# Patient Record
Sex: Female | Born: 1970 | Race: White | Hispanic: No | Marital: Single | Smoking: Never smoker
Health system: Southern US, Community
[De-identification: ages and names within clinical notes are randomized; demographics above are authoritative.]

## PROBLEM LIST (undated history)

## (undated) DIAGNOSIS — G43909 Migraine, unspecified, not intractable, without status migrainosus: Secondary | ICD-10-CM

## (undated) DIAGNOSIS — G459 Transient cerebral ischemic attack, unspecified: Secondary | ICD-10-CM

## (undated) DIAGNOSIS — R7989 Other specified abnormal findings of blood chemistry: Secondary | ICD-10-CM

## (undated) HISTORY — DX: Other specified abnormal findings of blood chemistry: R79.89

## (undated) HISTORY — DX: Transient cerebral ischemic attack, unspecified: G45.9

---

## 2004-03-04 ENCOUNTER — Inpatient Hospital Stay (HOSPITAL_COMMUNITY): Admission: AD | Admit: 2004-03-04 | Discharge: 2004-03-07 | Payer: Self-pay | Admitting: Obstetrics and Gynecology

## 2004-03-09 ENCOUNTER — Encounter: Admission: RE | Admit: 2004-03-09 | Discharge: 2004-04-08 | Payer: Self-pay | Admitting: Obstetrics and Gynecology

## 2004-04-10 ENCOUNTER — Encounter: Admission: RE | Admit: 2004-04-10 | Discharge: 2004-05-10 | Payer: Self-pay | Admitting: Obstetrics and Gynecology

## 2004-05-02 ENCOUNTER — Other Ambulatory Visit: Admission: RE | Admit: 2004-05-02 | Discharge: 2004-05-02 | Payer: Self-pay | Admitting: Obstetrics and Gynecology

## 2004-06-10 ENCOUNTER — Encounter: Admission: RE | Admit: 2004-06-10 | Discharge: 2004-07-03 | Payer: Self-pay | Admitting: Obstetrics and Gynecology

## 2005-04-23 ENCOUNTER — Other Ambulatory Visit: Admission: RE | Admit: 2005-04-23 | Discharge: 2005-04-23 | Payer: Self-pay | Admitting: Obstetrics and Gynecology

## 2006-02-17 ENCOUNTER — Inpatient Hospital Stay (HOSPITAL_COMMUNITY): Admission: AD | Admit: 2006-02-17 | Discharge: 2006-02-17 | Payer: Self-pay | Admitting: Obstetrics and Gynecology

## 2006-02-20 ENCOUNTER — Inpatient Hospital Stay (HOSPITAL_COMMUNITY): Admission: RE | Admit: 2006-02-20 | Discharge: 2006-02-23 | Payer: Self-pay | Admitting: Obstetrics and Gynecology

## 2010-08-31 ENCOUNTER — Other Ambulatory Visit: Payer: Self-pay | Admitting: Obstetrics and Gynecology

## 2012-10-28 ENCOUNTER — Other Ambulatory Visit: Payer: Self-pay | Admitting: Obstetrics and Gynecology

## 2013-08-29 ENCOUNTER — Encounter (HOSPITAL_COMMUNITY): Payer: Self-pay | Admitting: Emergency Medicine

## 2013-08-29 ENCOUNTER — Observation Stay (HOSPITAL_COMMUNITY)
Admission: EM | Admit: 2013-08-29 | Discharge: 2013-08-30 | Disposition: A | Discharge status: Home / Self Care | Payer: 59 | Attending: Internal Medicine | Admitting: Internal Medicine

## 2013-08-29 ENCOUNTER — Emergency Department (HOSPITAL_COMMUNITY): Payer: 59

## 2013-08-29 DIAGNOSIS — R200 Anesthesia of skin: Secondary | ICD-10-CM

## 2013-08-29 DIAGNOSIS — G43909 Migraine, unspecified, not intractable, without status migrainosus: Secondary | ICD-10-CM | POA: Insufficient documentation

## 2013-08-29 DIAGNOSIS — G459 Transient cerebral ischemic attack, unspecified: Principal | ICD-10-CM | POA: Insufficient documentation

## 2013-08-29 DIAGNOSIS — R29898 Other symptoms and signs involving the musculoskeletal system: Secondary | ICD-10-CM

## 2013-08-29 DIAGNOSIS — R61 Generalized hyperhidrosis: Secondary | ICD-10-CM

## 2013-08-29 DIAGNOSIS — R209 Unspecified disturbances of skin sensation: Secondary | ICD-10-CM

## 2013-08-29 DIAGNOSIS — R0602 Shortness of breath: Secondary | ICD-10-CM | POA: Insufficient documentation

## 2013-08-29 DIAGNOSIS — I959 Hypotension, unspecified: Secondary | ICD-10-CM | POA: Insufficient documentation

## 2013-08-29 DIAGNOSIS — R42 Dizziness and giddiness: Secondary | ICD-10-CM

## 2013-08-29 LAB — RAPID URINE DRUG SCREEN, HOSP PERFORMED
AMPHETAMINES: NOT DETECTED
BENZODIAZEPINES: NOT DETECTED
Barbiturates: NOT DETECTED
COCAINE: NOT DETECTED
Opiates: POSITIVE — AB
TETRAHYDROCANNABINOL: NOT DETECTED

## 2013-08-29 LAB — APTT: aPTT: 29 seconds (ref 24–37)

## 2013-08-29 LAB — CBC
HCT: 41.8 % (ref 36.0–46.0)
HEMATOCRIT: 41.6 % (ref 36.0–46.0)
Hemoglobin: 14.1 g/dL (ref 12.0–15.0)
Hemoglobin: 14.4 g/dL (ref 12.0–15.0)
MCH: 30.5 pg (ref 26.0–34.0)
MCH: 30.8 pg (ref 26.0–34.0)
MCHC: 33.9 g/dL (ref 30.0–36.0)
MCHC: 34.4 g/dL (ref 30.0–36.0)
MCV: 89.8 fL (ref 78.0–100.0)
MCV: 89.5 fL (ref 78.0–100.0)
PLATELETS: 224 10*3/uL (ref 150–400)
Platelets: 214 10*3/uL (ref 150–400)
RBC: 4.63 MIL/uL (ref 3.87–5.11)
RBC: 4.67 MIL/uL (ref 3.87–5.11)
RDW: 13.1 % (ref 11.5–15.5)
RDW: 13.2 % (ref 11.5–15.5)
WBC: 6.1 10*3/uL (ref 4.0–10.5)
WBC: 5.6 10*3/uL (ref 4.0–10.5)

## 2013-08-29 LAB — DIFFERENTIAL
BASOS ABS: 0 10*3/uL (ref 0.0–0.1)
BASOS PCT: 0 % (ref 0–1)
EOS PCT: 3 % (ref 0–5)
Eosinophils Absolute: 0.2 10*3/uL (ref 0.0–0.7)
Lymphocytes Relative: 15 % (ref 12–46)
Lymphs Abs: 0.9 10*3/uL (ref 0.7–4.0)
MONO ABS: 0.6 10*3/uL (ref 0.1–1.0)
Monocytes Relative: 10 % (ref 3–12)
NEUTROS ABS: 4.3 10*3/uL (ref 1.7–7.7)
Neutrophils Relative %: 71 % (ref 43–77)

## 2013-08-29 LAB — COMPREHENSIVE METABOLIC PANEL
ALBUMIN: 3.9 g/dL (ref 3.5–5.2)
ALT: 20 U/L (ref 0–35)
AST: 14 U/L (ref 0–37)
Alkaline Phosphatase: 53 U/L (ref 39–117)
BUN: 9 mg/dL (ref 6–23)
CALCIUM: 9.3 mg/dL (ref 8.4–10.5)
CHLORIDE: 107 meq/L (ref 96–112)
CO2: 22 mEq/L (ref 19–32)
CREATININE: 0.71 mg/dL (ref 0.50–1.10)
GFR calc Af Amer: >90 mL/min (ref >90)
GFR calc non Af Amer: >90 mL/min (ref >90)
Glucose, Bld: 89 mg/dL (ref 70–99)
Potassium: 3.7 mEq/L (ref 3.7–5.3)
Sodium: 140 mEq/L (ref 137–147)
Total Bilirubin: 0.3 mg/dL (ref 0.3–1.2)
Total Protein: 7.6 g/dL (ref 6.0–8.3)

## 2013-08-29 LAB — I-STAT CHEM 8, ED
BUN: 8 mg/dL (ref 6–23)
CHLORIDE: 107 meq/L (ref 96–112)
Calcium, Ion: 1.27 mmol/L — ABNORMAL HIGH (ref 1.12–1.23)
Creatinine, Ser: 0.9 mg/dL (ref 0.50–1.10)
Glucose, Bld: 88 mg/dL (ref 70–99)
HCT: 45 % (ref 36.0–46.0)
Hemoglobin: 15.3 g/dL — ABNORMAL HIGH (ref 12.0–15.0)
Potassium: 3.6 mEq/L — ABNORMAL LOW (ref 3.7–5.3)
Sodium: 144 mEq/L (ref 137–147)
TCO2: 21 mmol/L (ref 0–100)

## 2013-08-29 LAB — ETHANOL

## 2013-08-29 LAB — PROTIME-INR
INR: 1 (ref 0.00–1.49)
Prothrombin Time: 13 seconds (ref 11.6–15.2)

## 2013-08-29 LAB — URINALYSIS, ROUTINE W REFLEX MICROSCOPIC
BILIRUBIN URINE: NEGATIVE
Glucose, UA: NEGATIVE mg/dL
KETONES UR: NEGATIVE mg/dL
Leukocytes, UA: NEGATIVE
Nitrite: NEGATIVE
Protein, ur: NEGATIVE mg/dL
Specific Gravity, Urine: 1.02 (ref 1.005–1.030)
UROBILINOGEN UA: 0.2 mg/dL (ref 0.0–1.0)
pH: 7.5 (ref 5.0–8.0)

## 2013-08-29 LAB — CREATININE, SERUM
CREATININE: 0.56 mg/dL (ref 0.50–1.10)
GFR calc Af Amer: >90 mL/min (ref >90)

## 2013-08-29 LAB — URINE MICROSCOPIC-ADD ON

## 2013-08-29 LAB — I-STAT TROPONIN, ED: Troponin i, poc: 0 ng/mL (ref 0.00–0.08)

## 2013-08-29 LAB — CBG MONITORING, ED: Glucose-Capillary: 106 mg/dL — ABNORMAL HIGH (ref 70–99)

## 2013-08-29 LAB — TROPONIN I
Troponin I: <0.3 ng/mL (ref <0.30)
Troponin I: <0.3 ng/mL (ref <0.30)

## 2013-08-29 MED ORDER — AZITHROMYCIN 500 MG PO TABS
500.0000 mg | ORAL_TABLET | Freq: Every day | ORAL | Status: DC
  Administered 2013-08-29 – 2013-08-30 (×2): 500 mg via ORAL
  Filled 2013-08-29 (×2): qty 1

## 2013-08-29 MED ORDER — LORATADINE 10 MG PO TABS
10.0000 mg | ORAL_TABLET | Freq: Every day | ORAL | Status: DC
  Filled 2013-08-29 (×2): qty 1

## 2013-08-29 MED ORDER — HEPARIN SODIUM (PORCINE) 5000 UNIT/ML IJ SOLN
5000.0000 [IU] | Freq: Three times a day (TID) | INTRAMUSCULAR | Status: DC
  Administered 2013-08-29 – 2013-08-30 (×2): 5000 [IU] via SUBCUTANEOUS
  Filled 2013-08-29 (×5): qty 1

## 2013-08-29 MED ORDER — SUMATRIPTAN SUCCINATE 100 MG PO TABS
100.0000 mg | ORAL_TABLET | ORAL | Status: DC | PRN
  Filled 2013-08-29: qty 1

## 2013-08-29 MED ORDER — ACETAMINOPHEN 325 MG PO TABS: 650.0000 mg | ORAL_TABLET | Freq: Four times a day (QID) | ORAL | Status: DC | PRN

## 2013-08-29 MED ORDER — ACETAMINOPHEN 650 MG RE SUPP: 650.0000 mg | Freq: Four times a day (QID) | RECTAL | Status: DC | PRN

## 2013-08-29 MED ORDER — TOPIRAMATE 25 MG PO TABS
125.0000 mg | ORAL_TABLET | Freq: Every day | ORAL | Status: DC
  Administered 2013-08-29: 125 mg via ORAL
  Filled 2013-08-29 (×2): qty 1

## 2013-08-29 MED ORDER — SODIUM CHLORIDE 0.9 % IV BOLUS (SEPSIS)
500.0000 mL | Freq: Once | INTRAVENOUS | Status: AC
  Administered 2013-08-29: 500 mL via INTRAVENOUS

## 2013-08-29 MED ORDER — ONDANSETRON HCL 4 MG/2ML IJ SOLN: 4.0000 mg | Freq: Four times a day (QID) | INTRAMUSCULAR | Status: DC | PRN

## 2013-08-29 MED ORDER — ONDANSETRON HCL 4 MG PO TABS: 4.0000 mg | ORAL_TABLET | Freq: Four times a day (QID) | ORAL | Status: DC | PRN

## 2013-08-29 MED ORDER — MORPHINE SULFATE 2 MG/ML IJ SOLN: 2.0000 mg | INTRAMUSCULAR | Status: DC | PRN

## 2013-08-29 MED ORDER — POLYETHYLENE GLYCOL 3350 17 G PO PACK
17.0000 g | PACK | Freq: Every day | ORAL | Status: DC | PRN
  Filled 2013-08-29: qty 1

## 2013-08-29 MED ORDER — NITROGLYCERIN 0.4 MG SL SUBL: 0.4000 mg | SUBLINGUAL_TABLET | SUBLINGUAL | Status: DC | PRN

## 2013-08-29 MED ORDER — SODIUM CHLORIDE 0.9 % IJ SOLN
3.0000 mL | Freq: Two times a day (BID) | INTRAMUSCULAR | Status: DC
Start: 2013-08-29 — End: 2013-08-30
  Administered 2013-08-29 – 2013-08-30 (×2): 3 mL via INTRAVENOUS

## 2013-08-29 NOTE — ED Notes (Signed)
I have been unable to obtain IV access ( I tried twice in her right arm).  She is in CT as I write this; and I have just given phone report to [place] [employer] on [facility].  Her husband, who had been with her had to "leave to take care of our children".  Dr. Manus Gunningancour spoke with him before his departure, and he is aware that we are about to transport pt. To [facility] to see Neurology.  She has been awake, alert and oriented x 4 with clear speech.

## 2013-08-29 NOTE — ED Notes (Signed)
[facility] is here and she is transported without incident.

## 2013-08-29 NOTE — Code Documentation (Signed)
43 year old female presented to [facility] ED via private vehicle with stroke like sx.  [facility]5 for continum of care.  [facility].  See flowsheet for times.  Patient and husband report that all was normal this AM- while having breakfast she noticed sudden onset of left hand and arm numbness and weakness.  She describes the sensation as starting in her hand and working its way to her shoulder.  She and husband describe not being able to move the arm for less than a minute.  Denies other sx.  She does have hx of miagraines - states she had headache last night - took her meds and headache was gone this AM.  She also reports recent sinus infection for which she is taking a Z-pack currently.  No blurred vision.  She states this sx have never been present in the past.  NIHHS is currently 0 and she reports all sx have resolved.  Dr. Roseanne RenoStewart in - updated - examined patient.  Stroke clock reset to 0.  Handoff to McDonald[employer] CorporationChristina RN.  Patient and husband updated by Dr. Roseanne RenoStewart - instructed to call immediately for any recurrent stroke like sx.

## 2013-08-29 NOTE — ED Provider Notes (Signed)
Patient received as a code stroke from [facility]. Full H&P done by Dr. Manus Gunningancour.  She's awake, alert, and oriented. Reports improving left upper extremity weakness.  Dr.Stewart at the bedside.  Recommends MRI and admission for TIA w/u.  Shon Batonourtney F Horton, MD 08/29/13 1153

## 2013-08-29 NOTE — Consult Note (Signed)
Referring Physician: Dr. Boyce Medici    Chief Complaint: Acute onset of numbness and weakness involving left arm.  HPI: Penny Kerr is an 43 y.o. female history of migraine headaches who experienced acute onset of numbness and weakness involving left hand and arm at 9:15 AM today. She's had no previous history of focal deficits, including no deficits associated with migraine headaches. She has not had a headache today. CT scan of her head showed no acute intracranial abnormality. Numbness has resolved since arriving in the emergency room. NIH stroke score at this point is 0. Patient has not been on antiplatelet therapy.  LSN: 9:15 AM on 08/29/2013 tPA Given: No: Deficits resolved  MRankin: 0  History reviewed. No pertinent past medical history.  History reviewed. No pertinent family history.   Medications: I have reviewed the patient's current medications.  ROS: History obtained from the patient  General ROS: negative for - chills, fatigue, fever, night sweats, weight gain or weight loss Psychological ROS: negative for - behavioral disorder, hallucinations, memory difficulties, mood swings or suicidal ideation Ophthalmic ROS: negative for - blurry vision, double vision, eye pain or loss of vision ENT ROS: negative for - epistaxis, nasal discharge, oral lesions, sore throat, tinnitus or vertigo Allergy and Immunology ROS: negative for - hives or itchy/watery eyes Hematological and Lymphatic ROS: negative for - bleeding problems, bruising or swollen lymph nodes Endocrine ROS: negative for - galactorrhea, hair pattern changes, polydipsia/polyuria or temperature intolerance Respiratory ROS: negative for - cough, hemoptysis, shortness of breath or wheezing Cardiovascular ROS: negative for - chest pain, dyspnea on exertion, edema or irregular heartbeat Gastrointestinal ROS: negative for - abdominal pain, diarrhea, hematemesis, nausea/vomiting or stool incontinence Genito-Urinary ROS: negative  for - dysuria, hematuria, incontinence or urinary frequency/urgency Musculoskeletal ROS: negative for - joint swelling or muscular weakness Neurological ROS: as noted in HPI Dermatological ROS: negative for rash and skin lesion changes  Physical Examination: Blood pressure 111/69, pulse 65, temperature 98.1 F (36.7 C), temperature source Oral, resp. rate 17, height 5\' 2"  (1.575 m), weight 76.204 kg (168 lb), last menstrual period 08/29/2013, SpO2 100.00%.  Neurologic Examination: Mental Status: Alert, oriented, thought content appropriate.  Speech fluent without evidence of aphasia. Able to follow commands without difficulty. Cranial Nerves: II-Visual fields were normal. III/IV/VI-Pupils were equal and reacted. Extraocular movements were full and conjugate.    V/VII-no facial numbness and no facial weakness. VIII-normal. X-normal speech and symmetrical palatal movement. Motor: 5/5 bilaterally with normal tone and bulk Sensory: Normal throughout. Deep Tendon Reflexes: 1+ and symmetric. Plantars: Flexor bilaterally Cerebellar: Normal finger-to-nose testing. Carotid auscultation: Normal  Ct Head Wo Contrast  08/29/2013   CLINICAL DATA:  Code stroke. Shortness of breath and left arm numbness.  EXAM: CT HEAD WITHOUT CONTRAST  TECHNIQUE: Contiguous axial images were obtained from the base of the skull through the vertex without contrast.  COMPARISON:  None  FINDINGS: Normal appearance of the intracranial structures. No evidence for acute hemorrhage, mass lesion, midline shift, hydrocephalus or large infarct. No acute bony abnormality. The visualized sinuses are clear.  IMPRESSION: No acute intracranial abnormality.  Critical Value/emergent results were called by telephone at the time of interpretation on 08/29/2013 at 10:45 AM to Dr. Glynn Octave , who verbally acknowledged these results.   Electronically Signed   By: Davonna Belling M.D.   On: 08/29/2013 10:45   Dg Chest Portable 1  View  08/29/2013   CLINICAL DATA:  SHORTNESS OF BREATH NUMBNESS  EXAM: PORTABLE CHEST - 1 VIEW  COMPARISON:  None.  FINDINGS: Low lung volumes. Mild to moderately enlarged. There is diffuse prominence of the interstitial markings and peribronchial cuffing. No focal regions of consolidation or focal infiltrates. Osseous structures unremarkable.  IMPRESSION: Interstitial infiltrate, differential considerations are pulmonary edema, an infectious or inflammatory infiltrate cannot be excluded though are of lower differential consideration.   Electronically Signed   By: Salome HolmesHector  Cooper M.D.   On: 08/29/2013 10:53    Assessment: 43 y.o. female presenting with probable transient ischemic attack, although patient has history of migraine headaches and symptoms could conceivably be manifestations of migraine phenomena with vasospasm. Acute right subcortical cerebral infarction cannot be ruled out at this point.  Stroke Risk Factors - family history  Plan: 1. HgbA1c, fasting lipid panel 2. MRI, MRA  of the brain without contrast 3. PT consult, OT consult, Speech consult 4. Echocardiogram 5. Carotid dopplers 6. Prophylactic therapy-Antiplatelet med: Aspirin 81 mg per day 7. Studies to rule out hypercoagulable state 8. Telemetry monitoring   C.R. Roseanne RenoStewart, MD [facility]  08/29/2013, 11:39 AM

## 2013-08-29 NOTE — ED Notes (Addendum)
Pt states that she began having sob and lt arm numbness this morning.  Denies this ever happening before.  Denies chest pain.  States that she is on cough syrup and abx for a recent sinus infection and cough.

## 2013-08-29 NOTE — ED Provider Notes (Signed)
CSN: [id number]     Arrival date & time 08/29/13  6295 History   First MD Initiated Contact with Patient 08/29/13 1001     Chief Complaint  Patient presents with  . Shortness of Breath  . Numbness     (Consider location/radiation/quality/duration/timing/severity/associated sxs/prior Treatment) HPI Comments: Patient presents with numbness and tingling and weakness in her left arm that onset around 9:15 AM. She sitting at the table eating breakfast when this started. She reports numbness and tingling in her entire left arm which lasted 3-5 minutes before resolving. She continues to have numbness and tingling in her left hand and weakness in the arm. Initially she had some shortness of breath but this is resolved. No chest pain. No headache, difficulty speaking or swallowing. No weakness numbness or tingling in her legs. She endorses a history of recent diagnosis of sinusitis and is on Zithromax. History of migraines as well. She does not smoke.  The history is provided by the patient and the spouse.    History reviewed. No pertinent past medical history. Past Surgical History  Procedure Laterality Date  . Cesarean section     History reviewed. No pertinent family history. History  Substance Use Topics  . Smoking status: Never Smoker   . Smokeless tobacco: Not on file  . Alcohol Use: No   OB History   Grav Para Term Preterm Abortions TAB SAB Ect Mult Living                 Review of Systems  Constitutional: Negative for activity change and appetite change.  HENT: Negative for congestion and rhinorrhea.   Respiratory: Positive for shortness of breath. Negative for cough.   Cardiovascular: Negative for chest pain.  Gastrointestinal: Negative for nausea, vomiting and abdominal pain.  Genitourinary: Negative for dysuria, hematuria, vaginal bleeding and vaginal discharge.  Musculoskeletal: Negative for back pain.  Skin: Negative for rash.  Neurological: Positive for weakness and  numbness. Negative for dizziness, facial asymmetry and headaches.  A complete 10 system review of systems was obtained and all systems are negative except as noted in the HPI and PMH.      Allergies  Review of patient's allergies indicates no known allergies.  Home Medications   No current outpatient prescriptions on file. BP 121/63  Pulse 74  Temp(Src) 98.1 F (36.7 C) (Oral)  Resp 18  Ht 5\' 2"  (1.575 m)  Wt 168 lb (76.204 kg)  BMI 30.72 kg/m2  SpO2 100%  LMP 08/29/2013 Physical Exam  Constitutional: She is oriented to person, place, and time. She appears well-developed and well-nourished. No distress.  HENT:  Head: Normocephalic and atraumatic.  Mouth/Throat: Oropharynx is clear and moist. No oropharyngeal exudate.  Eyes: Conjunctivae and EOM are normal. Pupils are equal, round, and reactive to light.  Neck: Normal range of motion. Neck supple.  Cardiovascular: Normal rate, regular rhythm and normal heart sounds.   Pulmonary/Chest: Effort normal and breath sounds normal.  Abdominal: Soft. Bowel sounds are normal. There is no tenderness. There is no rebound and no guarding.  Musculoskeletal: Normal range of motion. She exhibits no edema and no tenderness.  Neurological: She is alert and oriented to person, place, and time. No cranial nerve deficit. She exhibits normal muscle tone. Coordination normal.  CN 2-12 intact, no ataxia on finger to nose, no nystagmus, t, no pronator drift, Romberg negative, normal gait. 4 of 5 strength in the left upper extremity, 5 out of 5 strength in the right upper extremity no  pronator drift Decreased sensation over left dorsal arm  Skin: Skin is warm.    ED Course  Procedures (including critical care time) Labs Review Labs Reviewed  URINE RAPID DRUG SCREEN (HOSP PERFORMED) - Abnormal; Notable for the following:    Opiates POSITIVE (*)    All other components within normal limits  URINALYSIS, ROUTINE W REFLEX MICROSCOPIC - Abnormal;  Notable for the following:    APPearance TURBID (*)    Hgb urine dipstick LARGE (*)    All other components within normal limits  I-STAT CHEM 8, ED - Abnormal; Notable for the following:    Potassium 3.6 (*)    Calcium, Ion 1.27 (*)    Hemoglobin 15.3 (*)    All other components within normal limits  CBG MONITORING, ED - Abnormal; Notable for the following:    Glucose-Capillary 106 (*)    All other components within normal limits  ETHANOL  PROTIME-INR  APTT  CBC  DIFFERENTIAL  COMPREHENSIVE METABOLIC PANEL  URINE MICROSCOPIC-ADD ON  CBC  CREATININE, SERUM  TROPONIN I  TROPONIN I  TROPONIN I  I-STAT TROPOININ, ED  I-STAT TROPOININ, ED   Imaging Review Ct Head Wo Contrast  08/29/2013   CLINICAL DATA:  Code stroke. Shortness of breath and left arm numbness.  EXAM: CT HEAD WITHOUT CONTRAST  TECHNIQUE: Contiguous axial images were obtained from the base of the skull through the vertex without contrast.  COMPARISON:  None  FINDINGS: Normal appearance of the intracranial structures. No evidence for acute hemorrhage, mass lesion, midline shift, hydrocephalus or large infarct. No acute bony abnormality. The visualized sinuses are clear.  IMPRESSION: No acute intracranial abnormality.  Critical Value/emergent results were called by telephone at the time of interpretation on 08/29/2013 at 10:45 AM to Dr. Glynn Octave , who verbally acknowledged these results.   Electronically Signed   By: Davonna Belling M.D.   On: 08/29/2013 10:45   Mr Brain Wo Contrast  08/29/2013   CLINICAL DATA:  Left upper extremity numbness now resolved. Complicated migraine versus subcortical infarction/TIA.  EXAM: MRI HEAD WITHOUT CONTRAST  TECHNIQUE: Multiplanar, multiecho pulse sequences of the brain and surrounding structures were obtained without intravenous contrast.  COMPARISON:  CT HEAD W/O CM dated 08/29/2013  FINDINGS: No evidence for acute infarction, hemorrhage, mass lesion, hydrocephalus, or extra-axial fluid.  There is no atrophy or white matter disease. Flow voids are maintained throughout the carotid, basilar, and vertebral arteries. There are no areas of chronic hemorrhage. Pituitary, pineal, and cerebellar tonsils unremarkable. No upper cervical lesions. Visualized calvarium, skull base, and upper cervical osseous structures unremarkable. Scalp and extracranial soft tissues, orbits, sinuses, and mastoids show no acute process.  IMPRESSION: Unremarkable cranial MRI. No acute or focal intracranial abnormality. Good general agreement with prior head CT.   Electronically Signed   By: Davonna Belling M.D.   On: 08/29/2013 13:32   Dg Chest Portable 1 View  08/29/2013   CLINICAL DATA:  SHORTNESS OF BREATH NUMBNESS  EXAM: PORTABLE CHEST - 1 VIEW  COMPARISON:  None.  FINDINGS: Low lung volumes. Mild to moderately enlarged. There is diffuse prominence of the interstitial markings and peribronchial cuffing. No focal regions of consolidation or focal infiltrates. Osseous structures unremarkable.  IMPRESSION: Interstitial infiltrate, differential considerations are pulmonary edema, an infectious or inflammatory infiltrate cannot be excluded though are of lower differential consideration.   Electronically Signed   By: Salome Holmes M.D.   On: 08/29/2013 10:53     EKG Interpretation   Date/Time:  Saturday August 29 2013 10:13:18 EDT Ventricular Rate:  72 PR Interval:  184 QRS Duration: 74 QT Interval:  425 QTC Calculation: 465 R Axis:   79 Text Interpretation:  Sinus rhythm Borderline repolarization abnormality  No previous ECGs available Confirmed by Amanee Iacovelli  MD, Tzipora Mcinroy ([id number]) on  08/29/2013 10:18:05 AM      MDM   Final diagnoses:  Left arm weakness   Acute onset of left arm numbness and weakness. Seems to be improving though the weakness persists. Patient with minimal stroke risk factors. Code stroke called on arrival. Does not appear to be tPA candidate 2/2 minimal deficits and rapidly improving  symptoms.  EKG is normal sinus rhythm. Case discussed with Dr. Roseanne RenoStewart who [facility]. Her NIH scale is 1 however and her deficits are minimal. D/w Dr. Wilkie AyeHorton and Dr. Rhys MartiniStewart  Mozell Hardacre, MD 08/29/13 [id number]

## 2013-08-29 NOTE — ED Notes (Signed)
Dr. Wilkie AyeHorton at bedside. Stanton Kidneyebra, RN at bedside. Pt [id number]

## 2013-08-29 NOTE — H&P (Addendum)
[facility] History and Physical  Penny Kerr ZOX:[id number] DOB: [birth year] DOA: 08/29/2013  Referring physician: Emergency Department PCP: No primary provider on file.  Specialists:   Chief Complaint: L arm numbness/weakness  HPI: Penny Kerr is a 43 y.o. female  With no significant PMH who presents to the ED with acute onset L arm numbness and weakness. By arrival to Ed, sx had resolved. In the ED, the pt was noted to have an unremarkable head CT. Neurology was consulted with recommendations for CVA w/u. Hospitalist service was consulted for admission.  On further questioning, pt reports sx that started around 9am on day of admit associated with diaphoresis, sob, palpitations, and dizziness.  Review of Systems:  Per above, the remainder of the 10pt ros reviewed and are neg  History reviewed. No pertinent past medical history. Past Surgical History  Procedure Laterality Date  . Cesarean section     Social History:  reports that she has never smoked. She does not have any smokeless tobacco history on file. She reports that she does not drink alcohol or use illicit drugs.  where does patient live--home, ALF, SNF? and with whom if at home?  Can patient participate in ADLs?  No Known Allergies  History reviewed. No pertinent family history.  (be sure to complete)  Prior to Admission medications   Medication Sig Start Date End Date Taking? Authorizing Provider  azithromycin (ZITHROMAX) 500 MG tablet Take 500 mg by mouth daily.   Yes Historical Provider, MD  cetirizine (ZYRTEC) 10 MG tablet Take 10 mg by mouth daily.   Yes Historical Provider, MD  HYDROcodone-homatropine (HYCODAN) 5-1.5 MG/5ML syrup Take 5 mLs by mouth every 6 (six) hours as needed for cough.   Yes Historical Provider, MD  SUMAtriptan (IMITREX) 100 MG tablet Take 100 mg by mouth every 2 (two) hours as needed for migraine.  08/08/13  Yes Historical Provider, MD  topiramate (TOPAMAX) 25 MG tablet Take 125  mg by mouth daily.  07/13/13  Yes Historical Provider, MD   Physical Exam: Filed Vitals:   08/29/13 1013 08/29/13 1127 08/29/13 1130 08/29/13 1200  BP: 111/65 111/69 117/69 100/60  Pulse: 73 65 80 70  Temp: 97.5 F (36.4 C) 98.1 F (36.7 C)    TempSrc: Oral Oral    Resp: 20 17 23 16   Height:  5\' 2"  (1.575 m)    Weight:  76.204 kg (168 lb)    SpO2: 100% 100% 100% 100%     General:  Awake, in nad  Eyes: PERRL B  ENT: membranes moist, dentition fair  Neck: trachea midline, neck supple  Cardiovascular: regular, s1, s2  Respiratory: normal resp effort, no wheezing  Abdomen: soft, nondistended  Skin: normal skin turgor, no abnormal skin lesions seen  Musculoskeletal: perfused, no clubbing  Psychiatric: mood/affect normal // no auditory/visual hallucindations  Neurologic: cn 2-12 grossly intact, strength/sensation intact  Labs on Admission:  Basic Metabolic Panel:  Recent Labs Lab 08/29/13 1050 08/29/13 1102  NA 140 144  K 3.7 3.6*  CL 107 107  CO2 22  --   GLUCOSE 89 88  BUN 9 8  CREATININE 0.71 0.90  CALCIUM 9.3  --    Liver Function Tests:  Recent Labs Lab 08/29/13 1050  AST 14  ALT 20  ALKPHOS 53  BILITOT 0.3  PROT 7.6  ALBUMIN 3.9   No results found for this basename: LIPASE, AMYLASE,  in the last 168 hours No results found for this basename: AMMONIA,  in the last 168 hours CBC:  Recent Labs Lab 08/29/13 1050 08/29/13 1102  WBC 6.1  --   NEUTROABS 4.3  --   HGB 14.1 15.3*  HCT 41.6 45.0  MCV 89.8  --   PLT 214  --    Cardiac Enzymes: No results found for this basename: CKTOTAL, CKMB, CKMBINDEX, TROPONINI,  in the last 168 hours  BNP (last 3 results) No results found for this basename: PROBNP,  in the last 8760 hours CBG:  Recent Labs Lab 08/29/13 1129  GLUCAP 106*    Radiological Exams on Admission: Ct Head Wo Contrast  08/29/2013   CLINICAL DATA:  Code stroke. Shortness of breath and left arm numbness.  EXAM: CT HEAD  WITHOUT CONTRAST  TECHNIQUE: Contiguous axial images were obtained from the base of the skull through the vertex without contrast.  COMPARISON:  None  FINDINGS: Normal appearance of the intracranial structures. No evidence for acute hemorrhage, mass lesion, midline shift, hydrocephalus or large infarct. No acute bony abnormality. The visualized sinuses are clear.  IMPRESSION: No acute intracranial abnormality.  Critical Value/emergent results were called by telephone at the time of interpretation on 08/29/2013 at 10:45 AM to Dr. Glynn OctaveSTEPHEN RANCOUR , who verbally acknowledged these results.   Electronically Signed   By: Davonna BellingJohn  Curnes M.D.   On: 08/29/2013 10:45   Dg Chest Portable 1 View  08/29/2013   CLINICAL DATA:  SHORTNESS OF BREATH NUMBNESS  EXAM: PORTABLE CHEST - 1 VIEW  COMPARISON:  None.  FINDINGS: Low lung volumes. Mild to moderately enlarged. There is diffuse prominence of the interstitial markings and peribronchial cuffing. No focal regions of consolidation or focal infiltrates. Osseous structures unremarkable.  IMPRESSION: Interstitial infiltrate, differential considerations are pulmonary edema, an infectious or inflammatory infiltrate cannot be excluded though are of lower differential consideration.   Electronically Signed   By: Salome HolmesHector  Cooper M.D.   On: 08/29/2013 10:53    EKG: Independently reviewed. NSR  Assessment/Plan Active Problems:   Left arm numbness   TIA (transient ischemic attack)   1. L arm numbness 1. Neurology was consulted through ED 2. Recs for CVA w/u including MRI, dopplers, echo, pt/ot/slp 3. ASA per neuro recs 4. On further questioning, pt also reports diaphoresis, dizziness, and sob with presenting sx 5. Will check serial cardiac enzymes and repeat ekg in am 6. Admit to med-tele 2. DVT prophylaxis 1. Heparin subQ  Code Status: Full (must indicate code status--if unknown or must be presumed, indicate so) Family Communication: Pt in room (indicate person spoken  with, if applicable, with phone number if by telephone) Disposition Plan: Pending (indicate anticipated LOS)  Time spent: 35min  Anatasia Tino K [facility] Pager [phone]  If 7PM-7AM, please contact night-coverage [email or web address] Password [email or web address] 08/29/2013, 12:55 PM

## 2013-08-29 NOTE — ED Notes (Signed)
Symptoms are resolved at this time.

## 2013-08-30 DIAGNOSIS — G459 Transient cerebral ischemic attack, unspecified: Secondary | ICD-10-CM

## 2013-08-30 DIAGNOSIS — R29898 Other symptoms and signs involving the musculoskeletal system: Secondary | ICD-10-CM

## 2013-08-30 DIAGNOSIS — R209 Unspecified disturbances of skin sensation: Secondary | ICD-10-CM

## 2013-08-30 DIAGNOSIS — R42 Dizziness and giddiness: Secondary | ICD-10-CM

## 2013-08-30 LAB — COMPREHENSIVE METABOLIC PANEL
ALT: 18 U/L (ref 0–35)
AST: 15 U/L (ref 0–37)
Albumin: 3.5 g/dL (ref 3.5–5.2)
Alkaline Phosphatase: 48 U/L (ref 39–117)
BILIRUBIN TOTAL: 0.3 mg/dL (ref 0.3–1.2)
BUN: 12 mg/dL (ref 6–23)
CALCIUM: 9 mg/dL (ref 8.4–10.5)
CHLORIDE: 107 meq/L (ref 96–112)
CO2: 18 meq/L — AB (ref 19–32)
CREATININE: 0.63 mg/dL (ref 0.50–1.10)
GFR calc non Af Amer: >90 mL/min (ref >90)
GLUCOSE: 87 mg/dL (ref 70–99)
Potassium: 3.8 mEq/L (ref 3.7–5.3)
Sodium: 141 mEq/L (ref 137–147)
Total Protein: 7 g/dL (ref 6.0–8.3)

## 2013-08-30 LAB — CBC
HCT: 41.2 % (ref 36.0–46.0)
HEMOGLOBIN: 14.1 g/dL (ref 12.0–15.0)
MCH: 30.8 pg (ref 26.0–34.0)
MCHC: 34.2 g/dL (ref 30.0–36.0)
MCV: 90 fL (ref 78.0–100.0)
Platelets: 237 10*3/uL (ref 150–400)
RBC: 4.58 MIL/uL (ref 3.87–5.11)
RDW: 13.3 % (ref 11.5–15.5)
WBC: 4.9 10*3/uL (ref 4.0–10.5)

## 2013-08-30 LAB — TROPONIN I

## 2013-08-30 MED ORDER — ASPIRIN 81 MG PO TBEC: 81.0000 mg | DELAYED_RELEASE_TABLET | Freq: Every day | ORAL | Status: DC

## 2013-08-30 MED ORDER — ASPIRIN EC 81 MG PO TBEC
81.0000 mg | DELAYED_RELEASE_TABLET | Freq: Every day | ORAL | Status: DC
  Administered 2013-08-30: 81 mg via ORAL
  Filled 2013-08-30: qty 1

## 2013-08-30 NOTE — Progress Notes (Signed)
  Echocardiogram 2D Echocardiogram has been performed.  Penny Kerr, Penny Kerr 08/30/2013, 10:14 AM

## 2013-08-30 NOTE — Progress Notes (Signed)
Stroke Team Progress Note  HISTORY Penny Kerr is a 43 y.o. female with history of migraine headaches who experienced acute onset of numbness and weakness involving left hand and arm at 9:15 AM 08/29/2013. She's had no previous history of focal deficits, including no deficits associated with migraine headaches. She had not had a headache on the day of admission. CT scan of her head showed no acute intracranial abnormality. Numbness resolved after arriving in the emergency room. NIH stroke score was 0. Patient has not been on antiplatelet therapy.   LSN: 9:15 AM on 08/29/2013  tPA Given: No: Deficits resolved  MRankin: 0   SUBJECTIVE The patient feels back to normal except for a mild odd sensation in her left hand which she is unable to describe. She is anxious for discharge. We discussed risk factors for strokes and TIAs.  OBJECTIVE Most recent Vital Signs: Filed Vitals:   08/30/13 0110 08/30/13 0300 08/30/13 0700 08/30/13 0933  BP: 113/57 105/55 92/79 97/51   Pulse: 69 65 79 81  Temp: 97.8 F (36.6 C) 97.8 F (36.6 C)  98 F (36.7 C)  TempSrc: Oral Oral  Oral  Resp: 18 20 18 18   Height:      Weight:      SpO2: 100% 100% 100% 100%   CBG (last 3)   Recent Labs  08/29/13 1129  GLUCAP 106*    IV Fluid Intake:     MEDICATIONS  . aspirin EC  81 mg Oral Daily  . azithromycin  500 mg Oral Daily  . heparin  5,000 Units Subcutaneous 3 times per day  . loratadine  10 mg Oral Daily  . sodium chloride  3 mL Intravenous Q12H  . topiramate  125 mg Oral Daily   PRN:  acetaminophen, acetaminophen, morphine injection, nitroGLYCERIN, ondansetron (ZOFRAN) IV, ondansetron, polyethylene glycol, SUMAtriptan  Diet:  General thin liquids Activity:  Bedrest DVT Prophylaxis:  Subcutaneous heparin  CLINICALLY SIGNIFICANT STUDIES Basic Metabolic Panel:   Recent Labs Lab 08/29/13 1050 08/29/13 1102 08/29/13 1639 08/30/13 0350  NA 140 144  --  141  K 3.7 3.6*  --  3.8  CL 107 107  --   107  CO2 22  --   --  18*  GLUCOSE 89 88  --  87  BUN 9 8  --  12  CREATININE 0.71 0.90 0.56 0.63  CALCIUM 9.3  --   --  9.0   Liver Function Tests:   Recent Labs Lab 08/29/13 1050 08/30/13 0350  AST 14 15  ALT 20 18  ALKPHOS 53 48  BILITOT 0.3 0.3  PROT 7.6 7.0  ALBUMIN 3.9 3.5   CBC:  Recent Labs Lab 08/29/13 1050  08/29/13 1639 08/30/13 0350  WBC 6.1  --  5.6 4.9  NEUTROABS 4.3  --   --   --   HGB 14.1  < > 14.4 14.1  HCT 41.6  < > 41.8 41.2  MCV 89.8  --  89.5 90.0  PLT 214  --  224 237  < > = values in this interval not displayed. Coagulation:   Recent Labs Lab 08/29/13 1050  LABPROT 13.0  INR 1.00   Cardiac Enzymes:   Recent Labs Lab 08/29/13 1639 08/29/13 2007 08/30/13 0350  TROPONINI <0.30 <0.30 <0.30   Urinalysis:   Recent Labs Lab 08/29/13 1200  COLORURINE PINK  LABSPEC 1.020  PHURINE 7.5  GLUCOSEU NEGATIVE  HGBUR LARGE*  BILIRUBINUR NEGATIVE  KETONESUR NEGATIVE  PROTEINUR NEGATIVE  UROBILINOGEN 0.2  NITRITE NEGATIVE  LEUKOCYTESUR NEGATIVE   Lipid Panel No results found for this basename: chol,  trig,  hdl,  cholhdl,  vldl,  ldlcalc   HgbA1C  No results found for this basename: HGBA1C    Urine Drug Screen:     Component Value Date/Time   LABOPIA POSITIVE* 08/29/2013 1200   COCAINSCRNUR NONE DETECTED 08/29/2013 1200   LABBENZ NONE DETECTED 08/29/2013 1200   AMPHETMU NONE DETECTED 08/29/2013 1200   THCU NONE DETECTED 08/29/2013 1200   LABBARB NONE DETECTED 08/29/2013 1200    Alcohol Level:   Recent Labs Lab 08/29/13 1050  ETH <11    Ct Head Wo Contrast 08/29/2013    No acute intracranial abnormality.     Mr Brain Wo Contrast 08/29/2013    Unremarkable cranial MRI. No acute or focal intracranial abnormality. Good general agreement with prior head CT.      Dg Chest Portable 1 View 08/29/2013    Interstitial infiltrate, differential considerations are pulmonary edema, an infectious or inflammatory infiltrate cannot be excluded  though are of lower differential consideration.      2D Echocardiogram pending   Carotid Doppler  Findings suggest 1-39% internal carotid artery stenosis bilaterally. Vertebral arteries are patent with antegrade flow.   EKG - normal sinus rhythm rate 72 beats per minute -  For complete results please see formal report.   Therapy Recommendations - probably does not need to be seen by the therapists as the patient's deficits have resolved and the CT and MRI were negative for a stroke.  Physical Exam   Mental Status:  Alert, oriented, thought content appropriate. Speech fluent without evidence of aphasia. Able to follow commands without difficulty.  Cranial Nerves:  II-Visual fields were normal.  III/IV/VI-Pupils were equal and reacted. Extraocular movements were full and conjugate.  V/VII-no facial numbness and no facial weakness.  VIII-normal.  X-normal speech and symmetrical palatal movement.  Motor: 5/5 bilaterally with normal tone and bulk  Sensory: Normal throughout.  Deep Tendon Reflexes: 1+ and symmetric.  Plantars: Flexor bilaterally  Cerebellar: Normal finger-to-nose testing.  Carotid auscultation: Normal   ASSESSMENT Ms. Penny Kerr is a 43 y.o. female presenting with transitory left upper extremity numbness and weakness. T-PA therapy was not initiated as the patient's symptoms quickly resolved. A head CT and MRI were both negative for infarct. On no antithrombotic prior to admission. Now on no antithrombotic for secondary stroke prevention. Patient with resultant resolution of deficits. Stroke work up underway.   History of migraine headaches  Mild hypotension  UDS positive for opiates   Hospital day # 1  TREATMENT/PLAN  Continue aspirin 81 mg orally every day for possible TIA.  Await therapy evaluations  Await 2-D echo, hemoglobin A1c, lipid panel   Delton See PA-C [facility] Pager [phone] 08/30/2013, 12:09 PM  I have  personally obtained a history, examined the patient, evaluated imaging results, and formulated the assessment and plan of care. I agree with the above.    To contact Stroke Continuity provider, please refer to [email or web address]. After hours, contact General Neurology

## 2013-08-30 NOTE — Progress Notes (Signed)
*  PRELIMINARY RESULTS* Vascular Ultrasound Carotid Duplex (Doppler) has been completed.   Findings suggest 1-39% internal carotid artery stenosis bilaterally. Vertebral arteries are patent with antegrade flow.  08/30/2013 10:05 AM Gertie FeyMichelle Kajal Scalici, RVT, RDCS, RDMS

## 2013-08-30 NOTE — Discharge Summary (Signed)
Physician Discharge Summary  Penny Kerr ZOX:[id number] DOB: 1970/10/04 DOA: 08/29/2013  PCP: No primary provider on file. Dr. Martha Clan  Admit date: 08/29/2013 Discharge date: 08/30/2013  Time spent: 35 minutes  Recommendations for Outpatient Follow-up:  1. Follow up with PCP in 1-2 weeks 2. Consider outpatient stress test  Discharge Diagnoses:  Active Problems:   Left arm numbness   TIA (transient ischemic attack)   Discharge Condition: Stable  Diet recommendation: Regular  Filed Weights   08/29/13 1127  Weight: 76.204 kg (168 lb)    History of present illness:  Penny Kerr is a 43 y.o. female  With no significant PMH who presents to the ED with acute onset L arm numbness and weakness. By arrival to Ed, sx had resolved. In the ED, the pt was noted to have an unremarkable head CT. Neurology was consulted with recommendations for CVA w/u. Hospitalist service was consulted for admission.   On further questioning, pt reports sx that started around 9am on day of admit associated with diaphoresis, sob, palpitations, and dizziness.  Hospital Course:  The patient was admitted to the floor. A 2d echo and carotid dopplers were done and were unremarkable. The patient was seen by Neurology with recommendations for aspirin. Given pt's family hx of heart disease and presenting symptoms, an outpatient stress test may be warranted. Cardiac enzymes were serially negative. EKG was normal. 2D echo was without wall motion abnormality.  Consultations:  Neurology  Discharge Exam: Filed Vitals:   08/30/13 0300 08/30/13 0700 08/30/13 0933 08/30/13 1407  BP: 105/55 92/79 97/51  104/57  Pulse: 65 79 81 78  Temp: 97.8 F (36.6 C)  98 F (36.7 C) 98.4 F (36.9 C)  TempSrc: Oral  Oral Oral  Resp: 20 18 18 18   Height:      Weight:      SpO2: 100% 100% 100% 100%    General: Awake, in nad Cardiovascular: regular, s1, s2 Respiratory: normal resp effort, no wheezing  Discharge  Instructions     Medication List         aspirin 81 MG EC tablet  Take 1 tablet (81 mg total) by mouth daily.     azithromycin 500 MG tablet  Commonly known as:  ZITHROMAX  Take 500 mg by mouth daily.     cetirizine 10 MG tablet  Commonly known as:  ZYRTEC  Take 10 mg by mouth daily.     HYDROcodone-homatropine 5-1.5 MG/5ML syrup  Commonly known as:  HYCODAN  Take 5 mLs by mouth every 6 (six) hours as needed for cough.     SUMAtriptan 100 MG tablet  Commonly known as:  IMITREX  Take 100 mg by mouth every 2 (two) hours as needed for migraine.     topiramate 25 MG tablet  Commonly known as:  TOPAMAX  Take 125 mg by mouth daily.       No Known Allergies Follow-up Information   Follow up with Martha Clan, MD. Schedule an appointment as soon as possible for a visit in 1 week.   Specialty:  Internal Medicine   Contact information:   [address] [place] [address] [phone]        The results of significant diagnostics from this hospitalization (including imaging, microbiology, ancillary and laboratory) are listed below for reference.    Significant Diagnostic Studies: Ct Head Wo Contrast  08/29/2013   CLINICAL DATA:  Code stroke. Shortness of breath and left arm numbness.  EXAM: CT HEAD WITHOUT  CONTRAST  TECHNIQUE: Contiguous axial images were obtained from the base of the skull through the vertex without contrast.  COMPARISON:  None  FINDINGS: Normal appearance of the intracranial structures. No evidence for acute hemorrhage, mass lesion, midline shift, hydrocephalus or large infarct. No acute bony abnormality. The visualized sinuses are clear.  IMPRESSION: No acute intracranial abnormality.  Critical Value/emergent results were called by telephone at the time of interpretation on 08/29/2013 at 10:45 AM to Dr. Glynn Octave , who verbally acknowledged these results.   Electronically Signed   By: Davonna Belling M.D.   On: 08/29/2013 10:45   Mr Brain Wo  Contrast  08/29/2013   CLINICAL DATA:  Left upper extremity numbness now resolved. Complicated migraine versus subcortical infarction/TIA.  EXAM: MRI HEAD WITHOUT CONTRAST  TECHNIQUE: Multiplanar, multiecho pulse sequences of the brain and surrounding structures were obtained without intravenous contrast.  COMPARISON:  CT HEAD W/O CM dated 08/29/2013  FINDINGS: No evidence for acute infarction, hemorrhage, mass lesion, hydrocephalus, or extra-axial fluid. There is no atrophy or white matter disease. Flow voids are maintained throughout the carotid, basilar, and vertebral arteries. There are no areas of chronic hemorrhage. Pituitary, pineal, and cerebellar tonsils unremarkable. No upper cervical lesions. Visualized calvarium, skull base, and upper cervical osseous structures unremarkable. Scalp and extracranial soft tissues, orbits, sinuses, and mastoids show no acute process.  IMPRESSION: Unremarkable cranial MRI. No acute or focal intracranial abnormality. Good general agreement with prior head CT.   Electronically Signed   By: Davonna Belling M.D.   On: 08/29/2013 13:32   Dg Chest Portable 1 View  08/29/2013   CLINICAL DATA:  SHORTNESS OF BREATH NUMBNESS  EXAM: PORTABLE CHEST - 1 VIEW  COMPARISON:  None.  FINDINGS: Low lung volumes. Mild to moderately enlarged. There is diffuse prominence of the interstitial markings and peribronchial cuffing. No focal regions of consolidation or focal infiltrates. Osseous structures unremarkable.  IMPRESSION: Interstitial infiltrate, differential considerations are pulmonary edema, an infectious or inflammatory infiltrate cannot be excluded though are of lower differential consideration.   Electronically Signed   By: Salome Holmes M.D.   On: 08/29/2013 10:53    Microbiology: No results found for this or any previous visit (from the past 240 hour(s)).   Labs: Basic Metabolic Panel:  Recent Labs Lab 08/29/13 1050 08/29/13 1102 08/29/13 1639 08/30/13 0350  NA 140 144   --  141  K 3.7 3.6*  --  3.8  CL 107 107  --  107  CO2 22  --   --  18*  GLUCOSE 89 88  --  87  BUN 9 8  --  12  CREATININE 0.71 0.90 0.56 0.63  CALCIUM 9.3  --   --  9.0   Liver Function Tests:  Recent Labs Lab 08/29/13 1050 08/30/13 0350  AST 14 15  ALT 20 18  ALKPHOS 53 48  BILITOT 0.3 0.3  PROT 7.6 7.0  ALBUMIN 3.9 3.5   No results found for this basename: LIPASE, AMYLASE,  in the last 168 hours No results found for this basename: AMMONIA,  in the last 168 hours CBC:  Recent Labs Lab 08/29/13 1050 08/29/13 1102 08/29/13 1639 08/30/13 0350  WBC 6.1  --  5.6 4.9  NEUTROABS 4.3  --   --   --   HGB 14.1 15.3* 14.4 14.1  HCT 41.6 45.0 41.8 41.2  MCV 89.8  --  89.5 90.0  PLT 214  --  224 237   Cardiac Enzymes:  Recent Labs Lab 08/29/13 1639 08/29/13 2007 08/30/13 0350  TROPONINI <0.30 <0.30 <0.30   BNP: BNP (last 3 results) No results found for this basename: PROBNP,  in the last 8760 hours CBG:  Recent Labs Lab 08/29/13 1129  GLUCAP 106*   Signed:  CHIU, STEPHEN K  [facility] 08/30/2013, 2:58 PM

## 2013-10-30 ENCOUNTER — Other Ambulatory Visit: Payer: Self-pay | Admitting: Obstetrics and Gynecology

## 2013-11-02 LAB — CYTOLOGY - PAP

## 2014-03-17 ENCOUNTER — Emergency Department (HOSPITAL_COMMUNITY): Payer: 59

## 2014-03-17 ENCOUNTER — Encounter (HOSPITAL_COMMUNITY): Payer: Self-pay | Admitting: Emergency Medicine

## 2014-03-17 ENCOUNTER — Emergency Department (HOSPITAL_COMMUNITY)
Admission: EM | Admit: 2014-03-17 | Discharge: 2014-03-17 | Disposition: A | Discharge status: Home / Self Care | Payer: 59 | Attending: Emergency Medicine | Admitting: Emergency Medicine

## 2014-03-17 DIAGNOSIS — Z7982 Long term (current) use of aspirin: Secondary | ICD-10-CM | POA: Diagnosis not present

## 2014-03-17 DIAGNOSIS — G43909 Migraine, unspecified, not intractable, without status migrainosus: Secondary | ICD-10-CM | POA: Insufficient documentation

## 2014-03-17 DIAGNOSIS — R200 Anesthesia of skin: Secondary | ICD-10-CM | POA: Insufficient documentation

## 2014-03-17 DIAGNOSIS — R42 Dizziness and giddiness: Secondary | ICD-10-CM | POA: Diagnosis present

## 2014-03-17 DIAGNOSIS — Z79899 Other long term (current) drug therapy: Secondary | ICD-10-CM | POA: Diagnosis not present

## 2014-03-17 DIAGNOSIS — M6281 Muscle weakness (generalized): Secondary | ICD-10-CM | POA: Insufficient documentation

## 2014-03-17 HISTORY — DX: Migraine, unspecified, not intractable, without status migrainosus: G43.909

## 2014-03-17 LAB — CBC WITH DIFFERENTIAL/PLATELET
Basophils Absolute: 0 10*3/uL (ref 0.0–0.1)
Basophils Relative: 0 % (ref 0–1)
Eosinophils Absolute: 0.1 10*3/uL (ref 0.0–0.7)
Eosinophils Relative: 2 % (ref 0–5)
HCT: 42.2 % (ref 36.0–46.0)
Hemoglobin: 14 g/dL (ref 12.0–15.0)
Lymphocytes Relative: 13 % (ref 12–46)
Lymphs Abs: 0.9 10*3/uL (ref 0.7–4.0)
MCH: 29.7 pg (ref 26.0–34.0)
MCHC: 33.2 g/dL (ref 30.0–36.0)
MCV: 89.4 fL (ref 78.0–100.0)
Monocytes Absolute: 0.4 10*3/uL (ref 0.1–1.0)
Monocytes Relative: 5 % (ref 3–12)
Neutro Abs: 5.7 10*3/uL (ref 1.7–7.7)
Neutrophils Relative %: 80 % — ABNORMAL HIGH (ref 43–77)
Platelets: 282 10*3/uL (ref 150–400)
RBC: 4.72 MIL/uL (ref 3.87–5.11)
RDW: 13 % (ref 11.5–15.5)
WBC: 7.1 10*3/uL (ref 4.0–10.5)

## 2014-03-17 LAB — BASIC METABOLIC PANEL
Anion gap: 14 (ref 5–15)
BUN: 13 mg/dL (ref 6–23)
CO2: 21 mEq/L (ref 19–32)
Calcium: 8.8 mg/dL (ref 8.4–10.5)
Chloride: 105 mEq/L (ref 96–112)
Creatinine, Ser: 0.62 mg/dL (ref 0.50–1.10)
GFR calc Af Amer: >90 mL/min (ref >90)
GFR calc non Af Amer: >90 mL/min (ref >90)
Glucose, Bld: 136 mg/dL — ABNORMAL HIGH (ref 70–99)
Potassium: 3.7 mEq/L (ref 3.7–5.3)
Sodium: 140 mEq/L (ref 137–147)

## 2014-03-17 LAB — TROPONIN I: Troponin I: <0.3 ng/mL (ref <0.30)

## 2014-03-17 NOTE — ED Provider Notes (Signed)
CSN: [id number]     Arrival date & time 03/17/14  1332 History   First MD Initiated Contact with Patient 03/17/14 1358     Chief Complaint  Patient presents with  . Dizziness  . Extremity Weakness     (Consider location/radiation/quality/duration/timing/severity/associated sxs/prior Treatment) HPI  43 year old female with left arm heaviness/numbness. Onset earlier today while at school. She was walking from the lunch room during symptom onset, but not particularly exerting herself. Symptoms with fairly abrupt onset. Associated with feeling dizzy which she describes a sensation that she may pass out. Denies vertigo. Denies any associated pain anywhere. No shortness of breath. Was having some mild nausea and sweating. Symptoms have since resolved. She reports history of similar symptoms which she was evaluated for and ultimately admitted for possible TIA. No known history of any coronary artery disease.  Past Medical History  Diagnosis Date  . Migraines    Past Surgical History  Procedure Laterality Date  . Cesarean section     History reviewed. No pertinent family history. History  Substance Use Topics  . Smoking status: Never Smoker   . Smokeless tobacco: Not on file  . Alcohol Use: No   OB History   Grav Para Term Preterm Abortions TAB SAB Ect Mult Living                 Review of Systems  All systems reviewed and negative, other than as noted in HPI.   Allergies  Review of patient's allergies indicates no known allergies.  Home Medications   Prior to Admission medications   Medication Sig Start Date End Date Taking? Authorizing Provider  aspirin EC 81 MG EC tablet Take 1 tablet (81 mg total) by mouth daily. 08/30/13   Jerald KiefStephen K Chiu, MD  azithromycin (ZITHROMAX) 500 MG tablet Take 500 mg by mouth daily.    Historical Provider, MD  cetirizine (ZYRTEC) 10 MG tablet Take 10 mg by mouth daily.    Historical Provider, MD  HYDROcodone-homatropine (HYCODAN) 5-1.5 MG/5ML  syrup Take 5 mLs by mouth every 6 (six) hours as needed for cough.    Historical Provider, MD  SUMAtriptan (IMITREX) 100 MG tablet Take 100 mg by mouth every 2 (two) hours as needed for migraine.  08/08/13   Historical Provider, MD  topiramate (TOPAMAX) 25 MG tablet Take 125 mg by mouth daily.  07/13/13   Historical Provider, MD   BP 119/85  Pulse 101  Temp(Src) 98.1 F (36.7 C) (Oral)  Resp 14  SpO2 100%  LMP 03/10/2014 Physical Exam  Nursing note and vitals reviewed. Constitutional: She is oriented to person, place, and time. She appears well-developed and well-nourished. No distress.  HENT:  Head: Normocephalic and atraumatic.  Eyes: Conjunctivae are normal. Right eye exhibits no discharge. Left eye exhibits no discharge.  Neck: Neck supple.  Cardiovascular: Normal rate, regular rhythm and normal heart sounds.  Exam reveals no gallop and no friction rub.   No murmur heard. Pulmonary/Chest: Effort normal and breath sounds normal. No respiratory distress.  Abdominal: Soft. She exhibits no distension. There is no tenderness.  Musculoskeletal: She exhibits no edema and no tenderness.  Neurological: She is alert and oriented to person, place, and time. No cranial nerve deficit. She exhibits normal muscle tone. Coordination normal.  Speech clear. Content appropriate. Follows commands. Good finger to nose testing bilaterally.  Skin: Skin is warm.  Psychiatric: She has a normal mood and affect. Her behavior is normal. Thought content normal.    ED Course  Procedures (including critical care time) Labs Review Labs Reviewed  CBC WITH DIFFERENTIAL - Abnormal; Notable for the following:    Neutrophils Relative % 80 (*)    All other components within normal limits  BASIC METABOLIC PANEL - Abnormal; Notable for the following:    Glucose, Bld 136 (*)    All other components within normal limits  TROPONIN I    Imaging Review No results found.  Ct Head Wo Contrast  03/17/2014    CLINICAL DATA:  Dizziness and left arm weakness  EXAM: CT HEAD WITHOUT CONTRAST  TECHNIQUE: Contiguous axial images were obtained from the base of the skull through the vertex without intravenous contrast.  COMPARISON:  MRI 08/29/2013  FINDINGS: Ventricle size is normal. Negative for acute or chronic infarction. Negative for hemorrhage or fluid collection. Negative for mass or edema. No shift of the midline structures.  Calvarium is intact.  IMPRESSION: Normal   Electronically Signed   By: Marlan Palauharles  Clark M.D.   On: 03/17/2014 14:46    EKG Interpretation   Date/Time:  Wednesday March 17 2014 13:40:30 EDT Ventricular Rate:  83 PR Interval:  192 QRS Duration: 80 QT Interval:  438 QTC Calculation: 515 R Axis:   83 Text Interpretation:  Sinus rhythm Borderline repolarization abnormality  Prolonged QT interval Otherwise no significant change Confirmed by Juleen ChinaKOHUT   MD, Ilaisaane Marts ([id number]) on 03/17/2014 2:53:01 PM      Ct Head Wo Contrast  03/17/2014   CLINICAL DATA:  Dizziness and left arm weakness  EXAM: CT HEAD WITHOUT CONTRAST  TECHNIQUE: Contiguous axial images were obtained from the base of the skull through the vertex without intravenous contrast.  COMPARISON:  MRI 08/29/2013  FINDINGS: Ventricle size is normal. Negative for acute or chronic infarction. Negative for hemorrhage or fluid collection. Negative for mass or edema. No shift of the midline structures.  Calvarium is intact.  IMPRESSION: Normal   Electronically Signed   By: Marlan Palauharles  Clark M.D.   On: 03/17/2014 14:46   MDM   Final diagnoses:  Left arm numbness    42yF with numbness in L hand/wrist. Admitted this past April with somewhat similar symptoms symptoms. Had extensive w/u including CT/MRI head, ECHO, carotid dopplers then. Not convinced having TIAs although certainly in differential. Some concern for potential cardiac etiology particularly with associated diaphoresis, dizziness and mild nausea with symptom onset. ED workup has  been pretty unremarkable. Discussed with patient and her significant other. I think ultimately patient needs a stress test. Discussed admission to the hospital for rule out and potentially further testing versus close outpatient follow-up to discuss stress testing with PCP. Patient is comfortable for discharge to obtain outpatient stress testing. I feel that this is reasonable. Return precautions were discussed.  Raeford RazorStephen Breshae Belcher, MD 03/24/14 [phone]

## 2014-03-17 NOTE — ED Notes (Signed)
Patient is resting comfortably. 

## 2014-03-17 NOTE — Discharge Instructions (Signed)

## 2014-03-17 NOTE — ED Notes (Addendum)
Pt c/o sudden dizziness and L arm weakness.  Denies pain.  Pt reports same symptoms last year.  Sts [facility], but did not receive a diagnosis.  Pt reports having a migraine yesterday.       Facial symmetry noted.  During assessment, slight weakness noted to L arm compared to R arm.

## 2014-03-17 NOTE — ED Notes (Addendum)
Pt reports left arm weakness and dizziness at 1200 PM today but denies at present time. Pt reports feels better.  Kohut MD at bedside.

## 2014-03-22 ENCOUNTER — Telehealth: Payer: Self-pay | Admitting: Cardiology

## 2014-03-22 NOTE — Telephone Encounter (Signed)
[facility] with Dr Jens Somrenshaw on 04/16/14  [facility] Dr Ludwig Clarksrenshaw's schedule of 04/16/14  lp

## 2014-04-06 ENCOUNTER — Ambulatory Visit (INDEPENDENT_AMBULATORY_CARE_PROVIDER_SITE_OTHER): Payer: 59 | Admitting: Neurology

## 2014-04-06 ENCOUNTER — Encounter: Payer: Self-pay | Admitting: Neurology

## 2014-04-06 VITALS — BP 98/65 | HR 85 | Temp 98.2°F | Ht 62.0 in | Wt 175.0 lb

## 2014-04-06 DIAGNOSIS — G43709 Chronic migraine without aura, not intractable, without status migrainosus: Secondary | ICD-10-CM

## 2014-04-06 DIAGNOSIS — R200 Anesthesia of skin: Secondary | ICD-10-CM

## 2014-04-06 DIAGNOSIS — G40109 Localization-related (focal) (partial) symptomatic epilepsy and epileptic syndromes with simple partial seizures, not intractable, without status epilepticus: Secondary | ICD-10-CM

## 2014-04-06 DIAGNOSIS — R29898 Other symptoms and signs involving the musculoskeletal system: Secondary | ICD-10-CM

## 2014-04-06 DIAGNOSIS — R208 Other disturbances of skin sensation: Secondary | ICD-10-CM

## 2014-04-06 MED ORDER — TOPIRAMATE 25 MG PO TABS: 50.0000 mg | ORAL_TABLET | Freq: Two times a day (BID) | ORAL | Status: AC

## 2014-04-06 NOTE — Patient Instructions (Signed)
Overall you are doing fairly well but I do want to suggest a few things today:   Remember to drink plenty of fluid, eat healthy meals and do not skip any meals. Try to eat protein with a every meal and eat a healthy snack such as fruit or nuts in between meals. Try to keep a regular sleep-wake schedule and try to exercise daily, particularly in the form of walking, 20-30 minutes a day, if you can.   As far as your medications are concerned, I would like to suggest: Topamax 50mg  twice daily  As far as diagnostic testing: EEG to rule out simple partial seizures of the left arm  I would like to see you back in 3 months, sooner if we need to. Please call us with any interim questions, concerns, problems, updates or refill requests.   Please also call us for any test results so we can go over those with you on the phone.  My clinical assistant and will answer any of your questions and relay your messages to me and also relay most of my messages to you.   Our phone number is [phone]. We also have an after hours call service for urgent matters and there is a physician on-call for urgent questions. For any emergencies you know to call 911 or go to the nearest emergency room

## 2014-04-06 NOTE — Progress Notes (Addendum)
[facility]    Provider:  Dr Lucia Gaskins Referring Provider: Martha Clan, MD Primary Care Physician:  Martha Clan, MD  CC:  Left arm numbness and weakness  HPI:  Penny Kerr is a 43 y.o. female here as a referral from Dr. Clelia Croft for episodes of left arm numbness and weakness. 43 year old female who, last April, had an episode of numbness in the left hand which progressed up arm with a strange feeling. She started sweating, felt hot, felt scared then felt the room closing in like she was going to pass out. She called her husband and he thought she was having a heart attack and they went to the ED. A full workup was negative including cardiac and stroke workup. Had a stroke consult, no tpa given. Symptoms lasted 4 hours. No abnormal shaking, confusion, tongue biting. No other associated symptoms. Thought it was tia vs complicated migraine. 3 weeks ago she was at work and she felt dizzy, off balance and felt left arm weakness which felt different than episode in April. Her face was red, she felt hot, then after 15 minutes she felt cold. She went to the [facility]. Workup was negative.  Symptoms last 1 hour. No headache associated with episodes, no aura. She has a history of migraines that start in the right frontal area, +light sensitivity, +sound sensitivity. Takes Imitrex and sits in the dark with resolution. Imitrex helps otherwise may last 2-3 days straight. Migraines can be severe. Has them 5-6x a month which is an improvement from more than 20 before strating topamax.  Still on the topamax. Takes 100mg  at night (not the extended release, is on generic topiramate 25mg ). No personal or family history of seizures.   Reviewed notes, labs and imaging from outside physicians, which showed: CBC/BMP/Troponins unremarkable from 10/21. Personally reviewed MRI of the brain that has no tumor/masses/demyelinating or other disease. Normal. Notes from ED in April state patient experienced acute  onset of numbness and weakness involving left hand and arm , CT scan of her head showed no acute intracranial abnormality.associated with diaphoresis, sob, palpitations, and dizziness. Admitted for workup. EKG and carotid dopplers unremarkable. Started on baby aspirin daily. 2D echo unremarkable. Outpatient stress test was recommended (she declined per history today, says she doesn't need it as this is not cardiac in nature). ED notes last month state feeling dizzy, mild nausea and sweating, weakness of left arm.   Review of Systems: Patient complains of symptoms per HPI as well as the following symptoms headache, snoring. Pertinent negatives per HPI. All others negative.   History   Social History  . Marital Status: Single    Spouse Name: N/A    Number of Children: N/A  . Years of Education: N/A   Occupational History  . Not on file.   Social History Main Topics  . Smoking status: Never Smoker   . Smokeless tobacco: Never Used  . Alcohol Use: No  . Drug Use: No  . Sexual Activity: Yes    Birth Control/ Protection: None   Other Topics Concern  . Not on file   Social History Narrative    Family History  Problem Relation Age of Onset  . Diabetes Mother   . CAD Father   . Deep vein thrombosis Sister   . Migraines Neg Hx   . Seizures Neg Hx     Past Medical History  Diagnosis Date  . Migraines     Past Surgical History  Procedure Laterality Date  . Cesarean section      Current Outpatient Prescriptions  Medication Sig Dispense Refill  . aspirin EC 81 MG tablet Take 81 mg by mouth at bedtime.    . cetirizine (ZYRTEC) 10 MG tablet Take 10 mg by mouth at bedtime.     . fluticasone (FLONASE) 50 MCG/ACT nasal spray Place 1 spray into both nostrils daily with breakfast.    . montelukast (SINGULAIR) 10 MG tablet Take 10 mg by mouth daily with breakfast.    . SUMAtriptan (IMITREX) 100 MG tablet Take 100 mg by mouth every 2 (two) hours as needed for migraine.     .  topiramate (TOPAMAX) 25 MG tablet Take 2 tablets (50 mg total) by mouth 2 (two) times daily. 120 tablet 6   No current facility-administered medications for this visit.    Allergies as of 04/06/2014  . (No Known Allergies)    Vitals: BP 98/65 mmHg  Pulse 85  Temp(Src) 98.2 F (36.8 C) (Oral)  Ht 5\' 2"  (1.575 m)  Wt 175 lb (79.379 kg)  BMI 32.00 kg/m2  LMP 03/10/2014 Last Weight:  Wt Readings from Last 1 Encounters:  04/06/14 175 lb (79.379 kg)   Last Height:   Ht Readings from Last 1 Encounters:  04/06/14 5\' 2"  (1.575 m)    Physical exam: Exam: Gen: NAD, conversant, well nourised, obese, well groomed                     CV: RRR, no MRG. No Carotid Bruits. No peripheral edema, warm, nontender Eyes: Conjunctivae clear without exudates or hemorrhage  Neuro: Detailed Neurologic Exam  Speech:    Speech is normal; fluent and spontaneous with normal comprehension.  Cognition:    The patient is oriented to person, place, and time;     recent and remote memory intact;     language fluent;     normal attention, concentration,     fund of knowledge Cranial Nerves:    The pupils are equal, round, and reactive to light. The fundi are normal and spontaneous venous pulsations are present. Visual fields are full to finger confrontation. Extraocular movements are intact. Trigeminal sensation is intact and the muscles of mastication are normal. The face is symmetric. The palate elevates in the midline. Voice is normal. Shoulder shrug is normal. The tongue has normal motion without fasciculations.   Coordination:    Normal finger to nose and heel to shin. Normal rapid alternating movements.   Gait:    Heel-toe and tandem gait are normal.   Motor Observation:    No asymmetry, no atrophy, and no involuntary movements noted. Tone:    Normal muscle tone.    Posture:    Posture is normal. normal erect    Strength:    Strength is V/V in the upper and lower limbs.        Sensation: intact     Reflex Exam:  DTR's:    Deep tendon reflexes in the upper and lower extremities are normal bilaterally.   Toes:    The toes are downgoing bilaterally.   Clonus:    Clonus is absent.      Assessment/Plan:  43 year old female with PMHx of migraines with 2 episodes of acute onset left arm weakness and numbness in the setting of sob, diaphoresis, fear, dizziness with a feeling she was going to pass out. Extensive workup with ekg,mri,carotids,2d echo was unremarkable. It was recommended she has an OP  stress test, she declined. I do agree she needs a stress test but patient still declines. DDX: panic attack, TIA, pre-syncope, complicated migraine or seizure.   - will order eeg - will change her topiramate to 50mg  bid instead of 100mg  qhs. Unclear why she was taking it all qhs, she denies side effects. Can increase if tolerates. Suggest holding off on using imitrex (Contraindicated in complicated migraines) until workup complete - follow up in 6 weeks. Call with any new episodes - agree she needs stress test. asa 81mg  daily.   Naomie DeanAntonia Pearce Littlefield, MD  [facility] [address] [place], [place] [id number]  Phone [phone] Fax [phone] Lesly DukesWILLIS,CHARLES KEITH

## 2014-04-16 ENCOUNTER — Ambulatory Visit: Payer: 59 | Admitting: Cardiology

## 2014-04-19 ENCOUNTER — Other Ambulatory Visit: Payer: 59 | Admitting: Radiology

## 2015-05-17 ENCOUNTER — Other Ambulatory Visit: Payer: Self-pay | Admitting: Obstetrics and Gynecology

## 2016-08-08 DIAGNOSIS — J019 Acute sinusitis, unspecified: Secondary | ICD-10-CM | POA: Diagnosis not present

## 2016-08-08 DIAGNOSIS — R590 Localized enlarged lymph nodes: Secondary | ICD-10-CM | POA: Diagnosis not present

## 2017-02-26 DIAGNOSIS — Z0000 Encounter for general adult medical examination without abnormal findings: Secondary | ICD-10-CM | POA: Diagnosis not present

## 2017-03-18 DIAGNOSIS — J3089 Other allergic rhinitis: Secondary | ICD-10-CM | POA: Diagnosis not present

## 2017-03-18 DIAGNOSIS — G43909 Migraine, unspecified, not intractable, without status migrainosus: Secondary | ICD-10-CM | POA: Diagnosis not present

## 2017-03-18 DIAGNOSIS — R7301 Impaired fasting glucose: Secondary | ICD-10-CM | POA: Diagnosis not present

## 2017-03-18 DIAGNOSIS — Z0000 Encounter for general adult medical examination without abnormal findings: Secondary | ICD-10-CM | POA: Diagnosis not present

## 2017-03-19 DIAGNOSIS — Z23 Encounter for immunization: Secondary | ICD-10-CM | POA: Diagnosis not present

## 2017-03-25 DIAGNOSIS — Z1212 Encounter for screening for malignant neoplasm of rectum: Secondary | ICD-10-CM | POA: Diagnosis not present

## 2017-06-14 DIAGNOSIS — R7301 Impaired fasting glucose: Secondary | ICD-10-CM | POA: Diagnosis not present

## 2017-06-14 DIAGNOSIS — G43909 Migraine, unspecified, not intractable, without status migrainosus: Secondary | ICD-10-CM | POA: Diagnosis not present

## 2017-09-16 DIAGNOSIS — R7301 Impaired fasting glucose: Secondary | ICD-10-CM | POA: Diagnosis not present

## 2017-09-25 ENCOUNTER — Encounter (INDEPENDENT_AMBULATORY_CARE_PROVIDER_SITE_OTHER): Payer: Self-pay | Admitting: Orthopaedic Surgery

## 2017-09-25 ENCOUNTER — Ambulatory Visit (INDEPENDENT_AMBULATORY_CARE_PROVIDER_SITE_OTHER): Payer: Self-pay

## 2017-09-25 ENCOUNTER — Ambulatory Visit (INDEPENDENT_AMBULATORY_CARE_PROVIDER_SITE_OTHER): Payer: 59 | Admitting: Orthopaedic Surgery

## 2017-09-25 DIAGNOSIS — M7062 Trochanteric bursitis, left hip: Secondary | ICD-10-CM

## 2017-09-25 DIAGNOSIS — G8929 Other chronic pain: Secondary | ICD-10-CM

## 2017-09-25 DIAGNOSIS — M545 Low back pain: Secondary | ICD-10-CM

## 2017-09-25 MED ORDER — LIDOCAINE HCL 1 % IJ SOLN
3.0000 mL | INTRAMUSCULAR | Status: AC | PRN
  Administered 2017-09-25: 3 mL

## 2017-09-25 MED ORDER — METHYLPREDNISOLONE ACETATE 40 MG/ML IJ SUSP
40.0000 mg | INTRAMUSCULAR | Status: AC | PRN
  Administered 2017-09-25: 40 mg via INTRA_ARTICULAR

## 2017-09-25 MED ORDER — NABUMETONE 500 MG PO TABS: 500.0000 mg | ORAL_TABLET | Freq: Two times a day (BID) | ORAL | 0 refills | Status: DC | PRN

## 2017-09-25 NOTE — Progress Notes (Signed)
Office Visit Note   Patient: Penny Kerr           Date of Birth: September 23, 1970           MRN: [id number] Visit Date: 09/25/2017              Requested by: Martha Clan, MD [address] [place], [place] [address] PCP: Martha Clan, MD   Assessment & Plan: Visit Diagnoses:  1. Chronic low back pain, unspecified back pain laterality, with sciatica presence unspecified   2. Trochanteric bursitis, left hip     Plan: I do think a lot of her problems are dealing with her hips more than her back.  I will put her on Relafen as an anti-inflammatory to take twice a day and I offered a steroid injection of the left hip trochanteric area which was the most painful area.  She agrees with this and she tolerated the injection well.  I also showed her stretching exercises to try.  We may end up sending her to physical therapy as an outpatient but I will see her back in a month see how she is doing overall.  If she still having the hip pain I would like a formal low AP pelvis standing with a lateral of both hips.  All questions concerns were answered and addressed.  She will also not lay on her left side at night.  She is a side sleeper on her left side.  Follow-Up Instructions: Return in about 1 month (around 10/23/2017).   Orders:  Orders Placed This Encounter  Procedures  . Large Joint Inj: L greater trochanter  . XR Lumbar Spine 2-3 Views   Meds ordered this encounter  Medications  . nabumetone (RELAFEN) 500 MG tablet    Sig: Take 1 tablet (500 mg total) by mouth 2 (two) times daily as needed.    Dispense:  60 tablet    Refill:  0      Procedures: Large Joint Inj: L greater trochanter on 09/25/2017 4:58 PM Indications: pain and diagnostic evaluation Details: 22 G 1.5 in needle, lateral approach  Arthrogram: No  Medications: 3 mL lidocaine 1 %; 40 mg methylPREDNISolone acetate 40 MG/ML Outcome: tolerated well, no immediate complications Procedure, treatment alternatives, risks and  benefits explained, specific risks discussed. Consent was given by the patient. Immediately prior to procedure a time out was called to verify the correct patient, procedure, equipment, support staff and site/side marked as required. Patient was prepped and draped in the usual sterile fashion.       Clinical Data: No additional findings.   Subjective: Chief Complaint  Patient presents with  . Lower Back - Pain  The patient somewhat I am seeing for the first time.  She is a very pleasant 47 year old female who comes in with mainly left leg and hip pain but she started having some right hip groin pain recently.  This started in the lower back and radiates all the way down to her foot and really started just recently and into March.  She woke up with significant pain but then is kind of waxed and waned on her in terms of what she is feeling.  She is been working on a lot of exercising with walking and occasionally running.  She has a history actually of born in [place] and knows that she had some type of hip problems as an infant they did require some type of surgery.  She is not a diabetic and not a  smoker.  She denies any numbness and tingling in her feet.  She is been taking ibuprofen and Aleve and things feel like there is likely getting worse.  She denies any change in bowel or bladder function.  She works as a Lawyer.  She has been told over the years that she does walk with a limp on occasion.  HPI  Review of Systems She currently denies any headache, chest pain, short of breath, fever, chills, nausea, vomiting.  Objective: Vital Signs: There were no vitals taken for this visit.  Physical Exam She is alert and oriented x3 and in no acute distress Ortho Exam On examination I can put both hips through full internal and external rotation with no significant pain in the groin.  She has severe pain to palpation over the left hip trochanteric area and the IT band.  The remainder of  her bilateral lower extremity exam is entirely normal in terms of excellent muscle tone and strength as well as normal sensation in all dermatomes. Specialty Comments:  No specialty comments available.  Imaging: Xr Lumbar Spine 2-3 Views  Result Date: 09/25/2017 An AP and lateral lumbar spine show no acute findings.  The spine is in normal alignment.  I can see the top of both hips and she has normal-appearing joint space but both hips have short femoral necks and each greater trochanter is slightly above the femoral head on both hips suggesting a congenital deformity.    PMFS History: Patient Active Problem List   Diagnosis Date Noted  . Left arm weakness 04/06/2014  . Left arm numbness 08/29/2013  . TIA (transient ischemic attack) 08/29/2013   Past Medical History:  Diagnosis Date  . Migraines     Family History  Problem Relation Age of Onset  . Diabetes Mother   . CAD Father   . Deep vein thrombosis Sister   . Migraines Neg Hx   . Seizures Neg Hx     Past Surgical History:  Procedure Laterality Date  . CESAREAN SECTION     Social History   Occupational History  . Not on file  Tobacco Use  . Smoking status: Never Smoker  . Smokeless tobacco: Never Used  Substance and Sexual Activity  . Alcohol use: No    Alcohol/week: 0.0 oz  . Drug use: No  . Sexual activity: Yes    Birth control/protection: None

## 2017-10-09 DIAGNOSIS — Z8249 Family history of ischemic heart disease and other diseases of the circulatory system: Secondary | ICD-10-CM | POA: Diagnosis not present

## 2017-10-09 DIAGNOSIS — R252 Cramp and spasm: Secondary | ICD-10-CM | POA: Diagnosis not present

## 2017-10-09 DIAGNOSIS — M79661 Pain in right lower leg: Secondary | ICD-10-CM | POA: Diagnosis not present

## 2017-10-10 DIAGNOSIS — R252 Cramp and spasm: Secondary | ICD-10-CM | POA: Diagnosis not present

## 2017-10-10 DIAGNOSIS — M79604 Pain in right leg: Secondary | ICD-10-CM | POA: Diagnosis not present

## 2017-10-10 DIAGNOSIS — M7989 Other specified soft tissue disorders: Secondary | ICD-10-CM | POA: Diagnosis not present

## 2017-10-23 ENCOUNTER — Ambulatory Visit (INDEPENDENT_AMBULATORY_CARE_PROVIDER_SITE_OTHER): Payer: 59 | Admitting: Orthopaedic Surgery

## 2017-10-23 ENCOUNTER — Encounter (INDEPENDENT_AMBULATORY_CARE_PROVIDER_SITE_OTHER): Payer: Self-pay | Admitting: Orthopaedic Surgery

## 2017-10-23 DIAGNOSIS — M7062 Trochanteric bursitis, left hip: Secondary | ICD-10-CM

## 2017-10-23 DIAGNOSIS — M545 Low back pain: Secondary | ICD-10-CM

## 2017-10-23 DIAGNOSIS — G8929 Other chronic pain: Secondary | ICD-10-CM

## 2017-10-23 NOTE — Progress Notes (Signed)
The patient is following up after being seen for left hip trochanteric bursitis as well as low back pain with some radicular symptoms going down her left leg.  She has not been on any anti-inflammatories that I prescribed due to fasting for religious/cultural purposes.  She says that the steroid injection in her trochanteric area on the left hip helped greatly.  On exam I can move her left hip around easily.  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  She still has some radicular symptoms on the lateral aspect of her left leg but her left hip and left knee exam are normal.  There is no foot drop either.  She will stop fasting next week and will start the anti-inflammatories twice daily.  Hopefully this will help her.  She is willing to give this a try.  She will call us for follow-up only if her symptoms do not resolve.

## 2018-01-15 DIAGNOSIS — Z803 Family history of malignant neoplasm of breast: Secondary | ICD-10-CM | POA: Diagnosis not present

## 2018-01-15 DIAGNOSIS — Z808 Family history of malignant neoplasm of other organs or systems: Secondary | ICD-10-CM | POA: Diagnosis not present

## 2018-01-15 DIAGNOSIS — Z1231 Encounter for screening mammogram for malignant neoplasm of breast: Secondary | ICD-10-CM | POA: Diagnosis not present

## 2018-01-17 ENCOUNTER — Other Ambulatory Visit: Payer: Self-pay | Admitting: Obstetrics and Gynecology

## 2018-01-17 DIAGNOSIS — R928 Other abnormal and inconclusive findings on diagnostic imaging of breast: Secondary | ICD-10-CM

## 2018-01-24 ENCOUNTER — Other Ambulatory Visit: Payer: Self-pay | Admitting: Obstetrics and Gynecology

## 2018-01-24 ENCOUNTER — Ambulatory Visit
Admission: RE | Admit: 2018-01-24 | Discharge: 2018-01-24 | Disposition: A | Discharge status: Home / Self Care | Payer: 59 | Source: Ambulatory Visit | Attending: Obstetrics and Gynecology | Admitting: Obstetrics and Gynecology

## 2018-01-24 DIAGNOSIS — R928 Other abnormal and inconclusive findings on diagnostic imaging of breast: Secondary | ICD-10-CM | POA: Diagnosis not present

## 2018-01-24 DIAGNOSIS — N6489 Other specified disorders of breast: Secondary | ICD-10-CM | POA: Diagnosis not present

## 2018-01-24 DIAGNOSIS — N63 Unspecified lump in unspecified breast: Secondary | ICD-10-CM

## 2018-02-10 DIAGNOSIS — Z6831 Body mass index (BMI) 31.0-31.9, adult: Secondary | ICD-10-CM | POA: Diagnosis not present

## 2018-02-10 DIAGNOSIS — R0781 Pleurodynia: Secondary | ICD-10-CM | POA: Diagnosis not present

## 2018-02-10 DIAGNOSIS — R071 Chest pain on breathing: Secondary | ICD-10-CM | POA: Diagnosis not present

## 2018-03-13 DIAGNOSIS — Z0000 Encounter for general adult medical examination without abnormal findings: Secondary | ICD-10-CM | POA: Diagnosis not present

## 2018-03-19 DIAGNOSIS — M25511 Pain in right shoulder: Secondary | ICD-10-CM | POA: Diagnosis not present

## 2018-03-19 DIAGNOSIS — Z0000 Encounter for general adult medical examination without abnormal findings: Secondary | ICD-10-CM | POA: Diagnosis not present

## 2018-03-19 DIAGNOSIS — R7301 Impaired fasting glucose: Secondary | ICD-10-CM | POA: Diagnosis not present

## 2018-03-19 DIAGNOSIS — Z23 Encounter for immunization: Secondary | ICD-10-CM | POA: Diagnosis not present

## 2018-03-19 DIAGNOSIS — Z1389 Encounter for screening for other disorder: Secondary | ICD-10-CM | POA: Diagnosis not present

## 2018-03-19 DIAGNOSIS — G43909 Migraine, unspecified, not intractable, without status migrainosus: Secondary | ICD-10-CM | POA: Diagnosis not present

## 2018-03-21 DIAGNOSIS — Z1212 Encounter for screening for malignant neoplasm of rectum: Secondary | ICD-10-CM | POA: Diagnosis not present

## 2018-05-30 DIAGNOSIS — E669 Obesity, unspecified: Secondary | ICD-10-CM | POA: Diagnosis not present

## 2018-05-30 DIAGNOSIS — G478 Other sleep disorders: Secondary | ICD-10-CM | POA: Diagnosis not present

## 2018-05-30 DIAGNOSIS — G43909 Migraine, unspecified, not intractable, without status migrainosus: Secondary | ICD-10-CM | POA: Diagnosis not present

## 2018-07-28 ENCOUNTER — Other Ambulatory Visit: Payer: 59

## 2019-04-10 ENCOUNTER — Other Ambulatory Visit: Payer: Self-pay | Admitting: Obstetrics and Gynecology

## 2019-04-10 DIAGNOSIS — N63 Unspecified lump in unspecified breast: Secondary | ICD-10-CM

## 2019-04-22 ENCOUNTER — Ambulatory Visit
Admission: RE | Admit: 2019-04-22 | Discharge: 2019-04-22 | Disposition: A | Discharge status: Home / Self Care | Payer: 59 | Source: Ambulatory Visit | Attending: Obstetrics and Gynecology | Admitting: Obstetrics and Gynecology

## 2019-04-22 ENCOUNTER — Other Ambulatory Visit: Payer: Self-pay

## 2019-04-22 DIAGNOSIS — N63 Unspecified lump in unspecified breast: Secondary | ICD-10-CM

## 2019-07-21 ENCOUNTER — Ambulatory Visit: Payer: 59 | Attending: Internal Medicine

## 2019-07-21 DIAGNOSIS — Z20822 Contact with and (suspected) exposure to covid-19: Secondary | ICD-10-CM

## 2019-07-22 LAB — NOVEL CORONAVIRUS, NAA: SARS-CoV-2, NAA: NOT DETECTED

## 2019-07-25 ENCOUNTER — Ambulatory Visit: Payer: 59 | Attending: Internal Medicine

## 2019-07-25 DIAGNOSIS — Z23 Encounter for immunization: Secondary | ICD-10-CM

## 2019-07-25 NOTE — Progress Notes (Signed)
   Covid-19 Vaccination Clinic  Name:  Penny Kerr    MRN: [id number] DOB: 02-10-1971  07/25/2019  Penny Kerr was observed post Covid-19 immunization for 15 minutes without incidence. She was provided with Vaccine Information Sheet and instruction to access the V-Safe system.   Penny Kerr was instructed to call 911 with any severe reactions post vaccine: Marland Kitchen Difficulty breathing  . Swelling of your face and throat  . A fast heartbeat  . A bad rash all over your body  . Dizziness and weakness    Immunizations Administered    Name Date Dose VIS Date Route   Pfizer COVID-19 Vaccine 07/25/2019  9:39 AM 0.3 mL 05/08/2019 Intramuscular   Manufacturer: [employer], [employer]   Lot: [id number]   NDC: [address]-9

## 2019-08-19 ENCOUNTER — Ambulatory Visit: Payer: 59 | Attending: Internal Medicine

## 2019-08-19 DIAGNOSIS — Z23 Encounter for immunization: Secondary | ICD-10-CM

## 2019-08-19 NOTE — Progress Notes (Signed)
   Covid-19 Vaccination Clinic  Name:  Penny Kerr    MRN: [id number] DOB: 09/26/1970  08/19/2019  [name] was observed post Covid-19 immunization for 15 minutes without incident. She was provided with Vaccine Information Sheet and instruction to access the V-Safe system.   [name] was instructed to call 911 with any severe reactions post vaccine: Marland Kitchen Difficulty breathing  . Swelling of face and throat  . A fast heartbeat  . A bad rash all over body  . Dizziness and weakness   Immunizations Administered    Name Date Dose VIS Date Route   Pfizer COVID-19 Vaccine 08/19/2019  9:41 AM 0.3 mL 05/08/2019 Intramuscular   Manufacturer: [employer], [employer]   Lot: [id number]   NDC: [address]-9

## 2019-12-18 IMAGING — MG DIGITAL DIAGNOSTIC UNILATERAL LEFT MAMMOGRAM WITH TOMO AND CAD
4 series · 4 of 12 positions shown · non-contrast
Comparison: Previous exam(s).

CLINICAL DATA: The patient was called back for left breast mass
located medially and slightly inferiorly.

EXAM:
DIGITAL DIAGNOSTIC LEFT MAMMOGRAM WITH TOMO
ULTRASOUND LEFT BREAST

[L CC synth-2D]
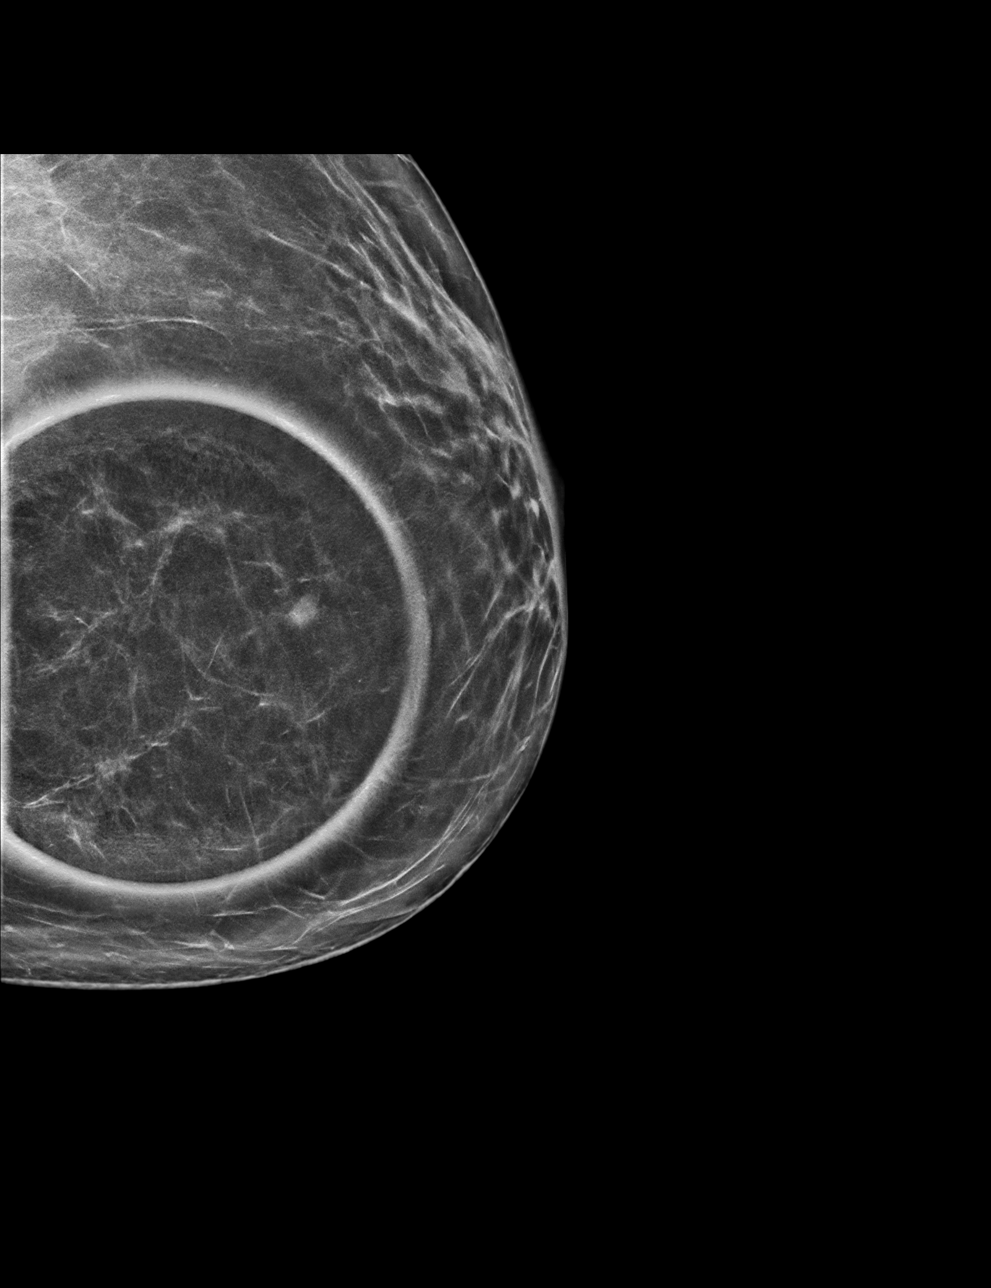

[L MLO synth-2D]
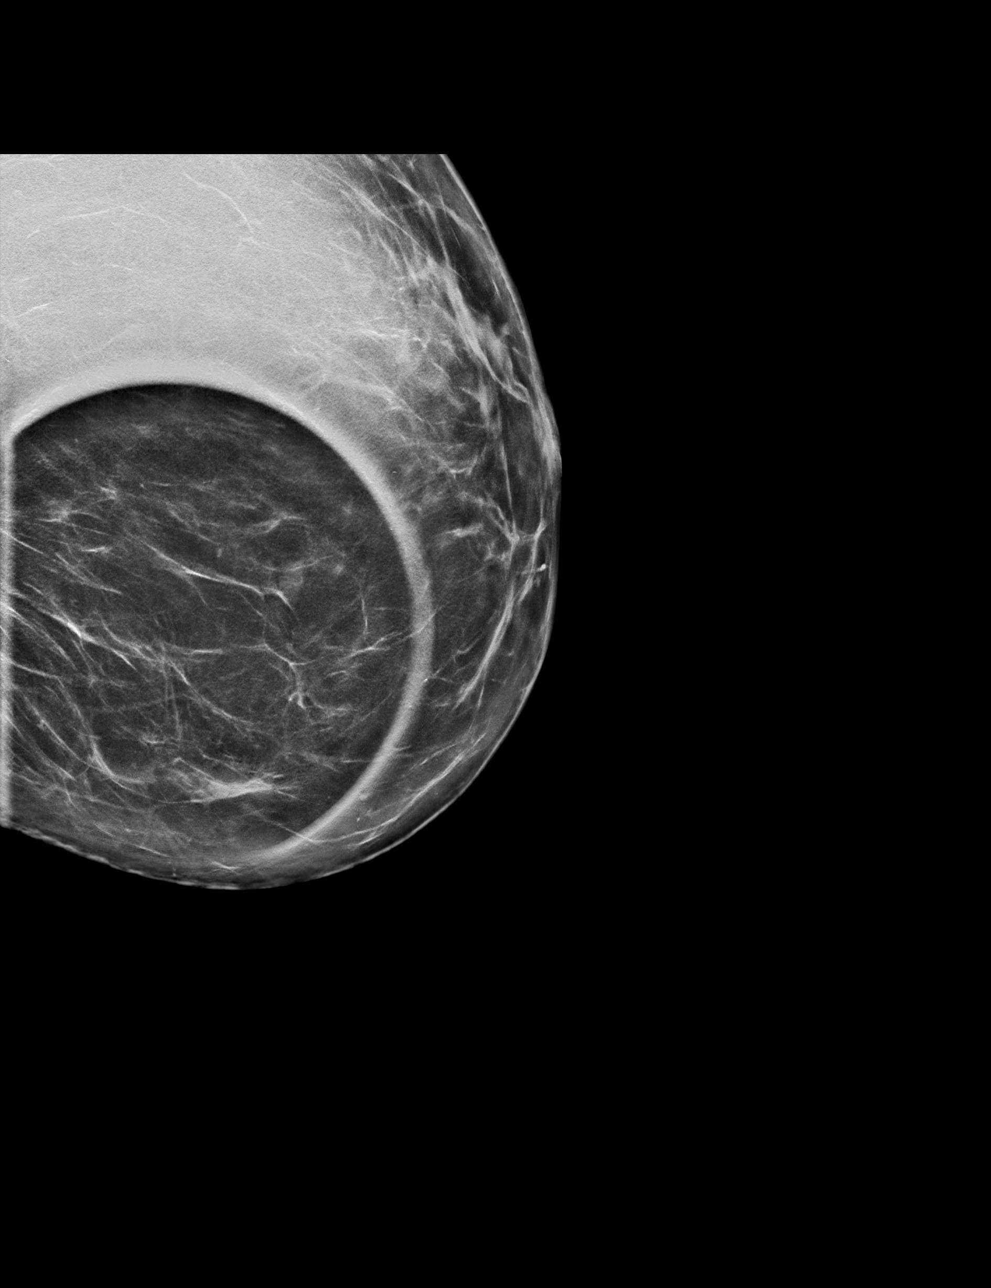

[L MLO tomo · tomo slice 39/76.0]
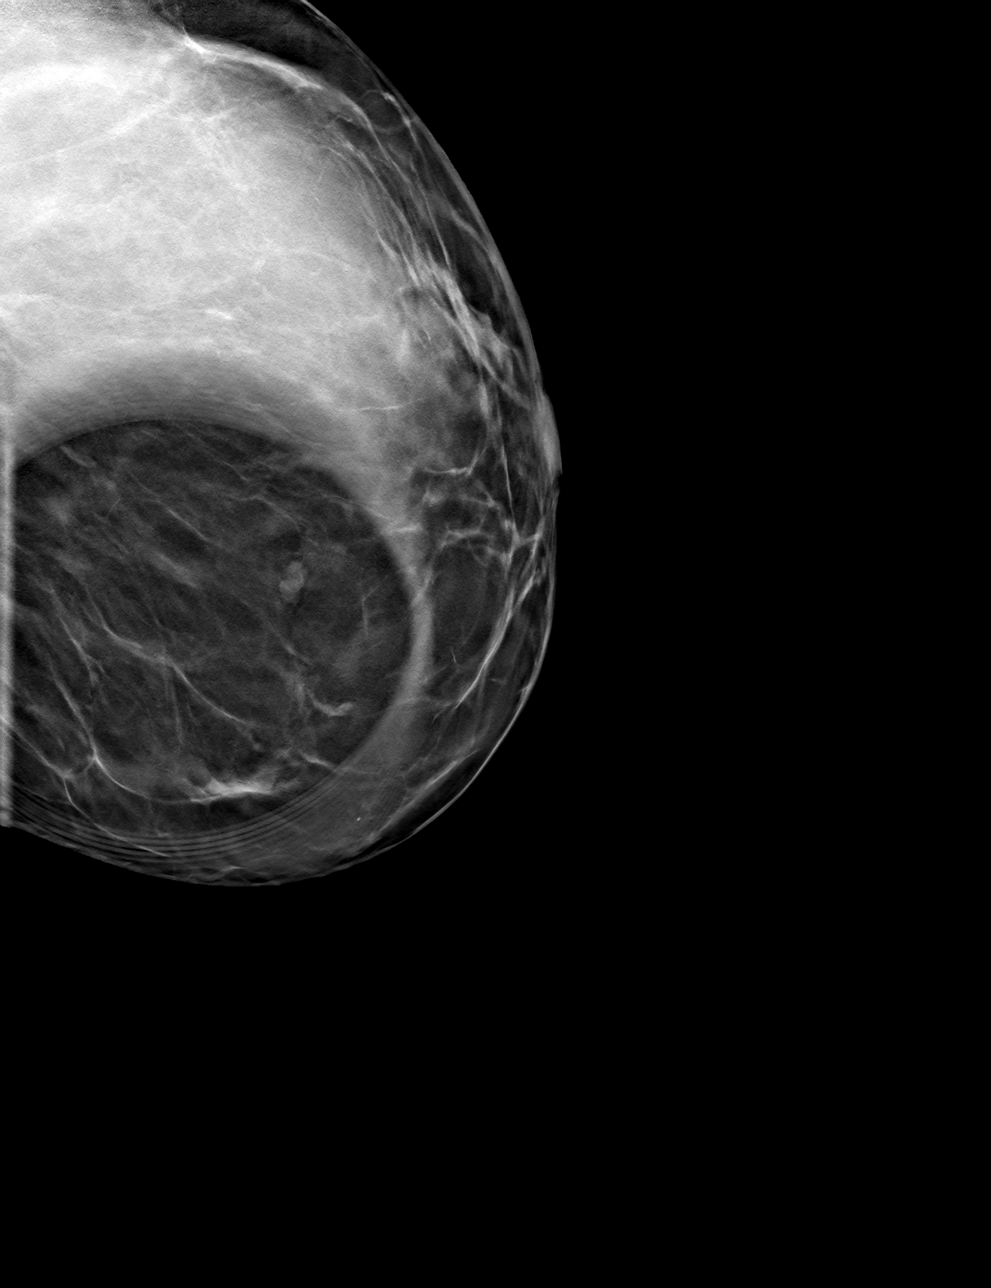

[L CC tomo · tomo slice 37/73.0]
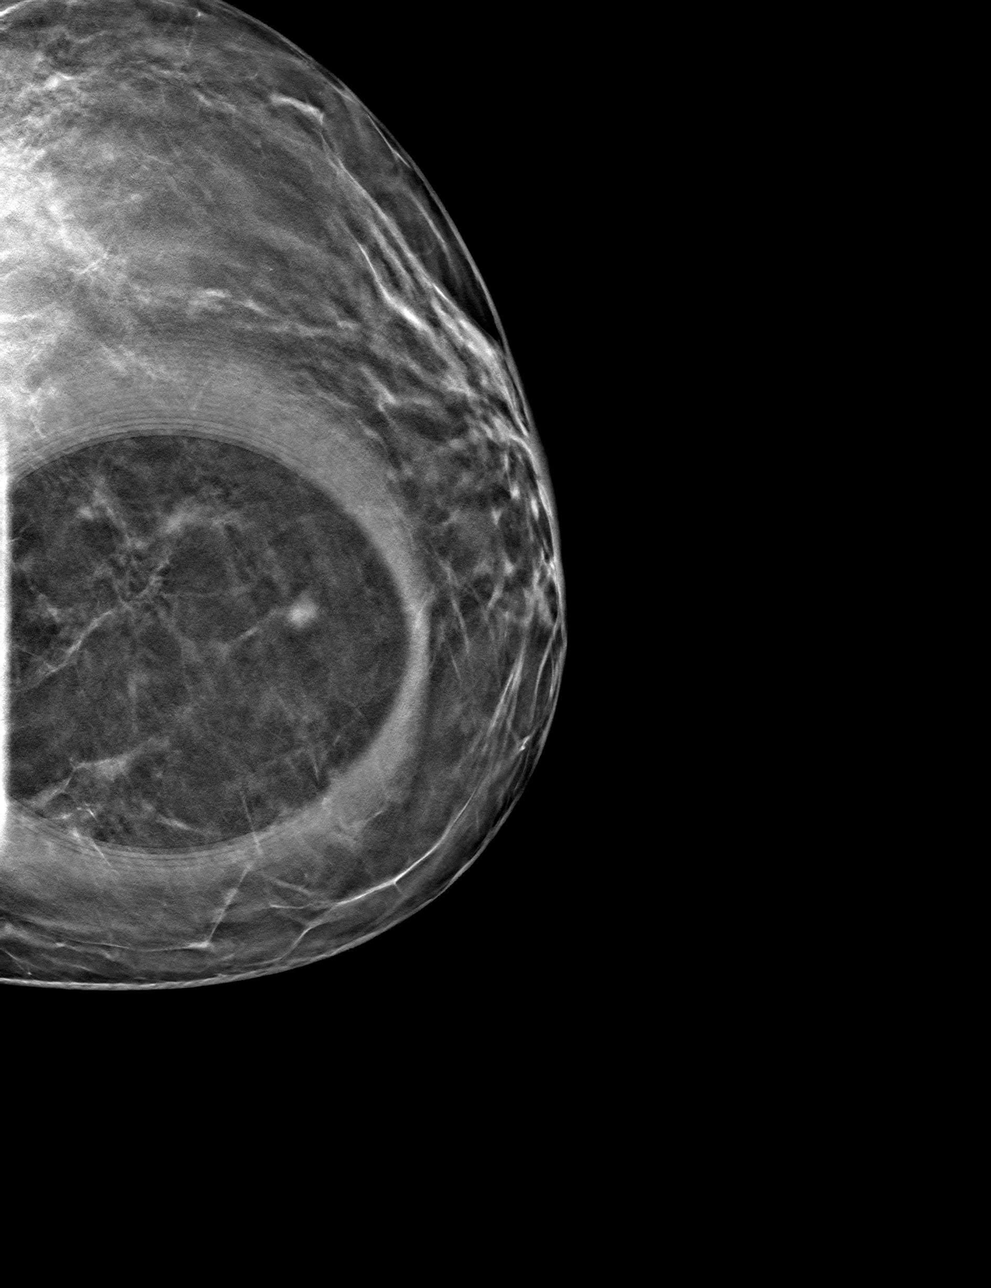

[4 of 12 positions shown; findings below may reference images not displayed]

ACR Breast Density Category b: There are scattered areas of
fibroglandular density.
FINDINGS: The left breast mass persists on additional imaging. The mass
measures approximately 6 mm.

On physical exam, no suspicious lumps are identified.

Targeted ultrasound is performed, showing a probable cluster of
cysts at 8 o'clock, 2 cm from the nipple measuring 7 x 3 x 5 mm,
likely correlating with the mammographic finding.
IMPRESSION: Probably benign left breast mass seen mammographically and
sonographically.

RECOMMENDATION:
Recommend six-month follow-up mammogram and ultrasound of the
probably benign left breast mass.

I have discussed the findings and recommendations with the patient.
Results were also provided in writing at the conclusion of the
visit. If applicable, a reminder letter will be sent to the patient
regarding the next appointment.

BI-RADS CATEGORY  3: Probably benign.

## 2020-07-04 ENCOUNTER — Encounter: Payer: Self-pay | Admitting: Family Medicine

## 2020-07-04 ENCOUNTER — Ambulatory Visit: Payer: Self-pay

## 2020-07-04 ENCOUNTER — Other Ambulatory Visit: Payer: Self-pay

## 2020-07-04 ENCOUNTER — Ambulatory Visit: Payer: 59 | Admitting: Family Medicine

## 2020-07-04 DIAGNOSIS — M5442 Lumbago with sciatica, left side: Secondary | ICD-10-CM | POA: Diagnosis not present

## 2020-07-04 MED ORDER — HYDROCODONE-ACETAMINOPHEN 5-325 MG PO TABS: 1.0000 | ORAL_TABLET | Freq: Four times a day (QID) | ORAL | 0 refills | Status: DC | PRN

## 2020-07-04 MED ORDER — BACLOFEN 10 MG PO TABS: 5.0000 mg | ORAL_TABLET | Freq: Three times a day (TID) | ORAL | 3 refills | Status: AC | PRN

## 2020-07-04 NOTE — Progress Notes (Signed)
   Office Visit Note   Patient: Penny Kerr           Date of Birth: [birth year]           MRN: [id number] Visit Date: 07/04/2020 Requested by: Penny Clan, MD [address] [place],  [place] [address] PCP: Penny Clan, MD  Subjective: Chief Complaint  Patient presents with  . Lower Back - Pain    Slipped and fell onto her left back this morning, while coming down 2 steps to go warm up the car. Pain radiates down the right leg. Hurts to move the right leg.    HPI: She is here with low back pain.  This morning she slipped on ice and fell directly on the left lower back.  It is very painful when she tries to move her left leg or put weight on her leg.  She had some problems with her back a few years ago and had some x-rays obtained.  Eventually she became pain-free and she was doing fine until today.  She is wearing a lumbar compression brace with some improvement.                ROS: No bowel or bladder dysfunction.  All other systems were reviewed and are negative.  Objective: Vital Signs: There were no vitals taken for this visit.  Physical Exam:  General:  Alert and oriented, in no acute distress. Pulm:  Breathing unlabored. Psy:  Normal mood, congruent affect. Skin: There is early bruising in the left lower lumbar area, just above the iliac crest. Low back: She is very tender to palpation over the L4 and L5 spinous processes in the midline.  She is also tender in the iliac crest region on the left.  Unable to assess lower extremity strength and reflexes due to pain.    Imaging: XR Lumbar Spine 2-3 Views  Result Date: 07/04/2020 X-rays lumbar spine reveal anatomic alignment, no definite fracture seen.   Assessment & Plan: 1.  Status post fall this morning with left lumbar contusion, cannot rule out bone bruise/occult fracture. -Pain medicine and muscle relaxant as needed.  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  If severe pain  persist, we will consider MRI scan.  Otherwise I will see her back as needed.     Procedures: No procedures performed        PMFS History: Patient Active Problem List   Diagnosis Date Noted  . Left arm weakness 04/06/2014  . Left arm numbness 08/29/2013  . TIA (transient ischemic attack) 08/29/2013   Past Medical History:  Diagnosis Date  . Migraines     Family History  Problem Relation Age of Onset  . Diabetes Mother   . CAD Father   . Deep vein thrombosis Sister   . Migraines Neg Hx   . Seizures Neg Hx     Past Surgical History:  Procedure Laterality Date  . CESAREAN SECTION     Social History   Occupational History  . Not on file  Tobacco Use  . Smoking status: Never Smoker  . Smokeless tobacco: Never Used  Substance and Sexual Activity  . Alcohol use: No    Alcohol/week: 0.0 standard drinks  . Drug use: No  . Sexual activity: Yes    Birth control/protection: None

## 2022-01-15 ENCOUNTER — Encounter: Payer: Self-pay | Admitting: Orthopaedic Surgery

## 2022-01-15 ENCOUNTER — Ambulatory Visit (INDEPENDENT_AMBULATORY_CARE_PROVIDER_SITE_OTHER): Payer: 59

## 2022-01-15 ENCOUNTER — Ambulatory Visit: Payer: 59 | Admitting: Orthopaedic Surgery

## 2022-01-15 DIAGNOSIS — M5442 Lumbago with sciatica, left side: Secondary | ICD-10-CM | POA: Diagnosis not present

## 2022-01-15 DIAGNOSIS — M7062 Trochanteric bursitis, left hip: Secondary | ICD-10-CM

## 2022-01-15 MED ORDER — METHYLPREDNISOLONE ACETATE 40 MG/ML IJ SUSP
40.0000 mg | INTRAMUSCULAR | Status: AC | PRN
  Administered 2022-01-15: 40 mg via INTRA_ARTICULAR

## 2022-01-15 MED ORDER — LIDOCAINE HCL 1 % IJ SOLN
3.0000 mL | INTRAMUSCULAR | Status: AC | PRN
  Administered 2022-01-15: 3 mL

## 2022-01-15 NOTE — Progress Notes (Signed)
Office Visit Note   Patient: Penny Kerr           Date of Birth: August 22, 1970           MRN: [id number] Visit Date: 01/15/2022              Requested by: Cleatis Polka., MD [address] [place],  [place] [address] PCP: Cleatis Polka., MD   Assessment & Plan: Visit Diagnoses:  1. Acute bilateral low back pain with left-sided sciatica   2. Trochanteric bursitis, left hip     Plan: Since the steroid injection has helped in the past over the left hip trochanteric area she was requesting this today and I agree.  I provided well without difficulty.  She is also going to try stretching exercises that I showed her and Voltaren gel.  Follow-up can be as needed.  All question concerns were answered and addressed.  If this does not improve my neck step would be formal outpatient physical therapy.  Follow-Up Instructions: Return if symptoms worsen or fail to improve.   Orders:  Orders Placed This Encounter  Procedures   Large Joint Inj   XR Lumbar Spine 2-3 Views   XR Pelvis 1-2 Views   No orders of the defined types were placed in this encounter.     Procedures: Large Joint Inj: L greater trochanter on 01/15/2022 4:07 PM Indications: pain and diagnostic evaluation Details: 22 G 1.5 in needle, lateral approach  Arthrogram: No  Medications: 3 mL lidocaine 1 %; 40 mg methylPREDNISolone acetate 40 MG/ML Outcome: tolerated well, no immediate complications Procedure, treatment alternatives, risks and benefits explained, specific risks discussed. Consent was given by the patient. Immediately prior to procedure a time out was called to verify the correct patient, procedure, equipment, support staff and site/side marked as required. Patient was prepped and draped in the usual sterile fashion.       Clinical Data: No additional findings.   Subjective: Chief Complaint  Patient presents with   Lower Back - Pain   Left Hip - Pain  The patient is someone I am seeing as  a new patient today because has been over 3 years since have seen her.  I last saw her in 2019 for left hip trochanteric bursitis and provided a steroid injection of her trochanteric area which helped her greatly.  She has been having pain in that hip for now about a year.  She is a side sleeper and it hurts to sleep on that side.  It does radiate down the left side of her thigh to her knee.  She does get some leg pain as well but no numbness and tingling.  She has had no treatments recently.  She has had no change in her medical status.  She is not overweight and she is only 51 years old.  HPI  Review of Systems There is currently listed no fever, chills, nausea, vomiting  Objective: Vital Signs: There were no vitals taken for this visit.  Physical Exam She is alert and oriented x3 and in no acute distress Ortho Exam Having her lay supine her leg lengths are equal.  Both hips have full and fluid range of motion with no pain in the groin at all.  She has severe tenderness to palpation over the trochanteric area of her left hip and the IT band.  The remainder of her bilateral lower extremity exam is normal and.  Her motor and sensory exam are  normal bilaterally. Specialty Comments:  No specialty comments available.  Imaging: XR Pelvis 1-2 Views  Result Date: 01/15/2022 An AP pelvis shows bilateral hip dysplasia.  XR Lumbar Spine 2-3 Views  Result Date: 01/15/2022 2 views of the lumbar spine show no acute findings with normal alignment.    PMFS History: Patient Active Problem List   Diagnosis Date Noted   Left arm weakness 04/06/2014   Left arm numbness 08/29/2013   TIA (transient ischemic attack) 08/29/2013   Past Medical History:  Diagnosis Date   Migraines     Family History  Problem Relation Age of Onset   Diabetes Mother    CAD Father    Deep vein thrombosis Sister    Migraines Neg Hx    Seizures Neg Hx     Past Surgical History:  Procedure Laterality Date    CESAREAN SECTION     Social History   Occupational History   Not on file  Tobacco Use   Smoking status: Never   Smokeless tobacco: Never  Substance and Sexual Activity   Alcohol use: No    Alcohol/week: 0.0 standard drinks of alcohol   Drug use: No   Sexual activity: Yes    Birth control/protection: None

## 2023-07-09 ENCOUNTER — Encounter: Payer: Self-pay | Admitting: Gastroenterology

## 2023-08-16 ENCOUNTER — Ambulatory Visit: Payer: 59 | Admitting: *Deleted

## 2023-08-16 VITALS — Ht 61.0 in | Wt 167.0 lb

## 2023-08-16 DIAGNOSIS — Z1211 Encounter for screening for malignant neoplasm of colon: Secondary | ICD-10-CM

## 2023-08-16 MED ORDER — SUFLAVE 178.7 G PO SOLR: 1.0000 | Freq: Once | ORAL | 0 refills | Status: AC

## 2023-08-16 NOTE — Progress Notes (Addendum)
 Pt's name and DOB verified at the beginning of the pre-visit wit 2 identifiers  Pt denies any difficulty with ambulating,sitting, laying down or rolling side to side  Pt has no issues with ambulation   Pt has no issues moving head neck or swallowing  No egg or soy allergy known to patient   Patient denies ever being intubated  No FH of Malignant Hyperthermia  Pt is not on diet pills or shots  Pt is not on home 02   Pt is not on blood thinners   Pt denies issues with constipation   Pt is not on dialysis  Pt denise any abnormal heart rhythms   Pt denies any upcoming cardiac testing  Patient's chart reviewed by Cathlyn Parsons CNRA prior to pre-visit and patient appropriate for the [facility].  Pre-visit completed and red dot placed by patient's name on their procedure day (on provider's schedule).    Visit by phone  Pt states weight is 167 lb  IInstructions reviewed. Pt given [facility], [facility] main # and MD on call # prior to instructions.  Pt states understanding of instructions. Instructed to review again prior to procedure. Pt states they will.   Informed pt that they will receive a text or  call from [facility] regarding there prep med.

## 2023-08-27 ENCOUNTER — Encounter: Payer: Self-pay | Admitting: Gastroenterology

## 2023-08-30 ENCOUNTER — Encounter: Payer: Self-pay | Admitting: Gastroenterology

## 2023-08-30 ENCOUNTER — Ambulatory Visit: Payer: 59 | Admitting: Gastroenterology

## 2023-08-30 VITALS — BP 109/73 | HR 66 | Temp 97.8°F | Resp 10 | Ht 61.0 in | Wt 167.0 lb

## 2023-08-30 DIAGNOSIS — Z1211 Encounter for screening for malignant neoplasm of colon: Secondary | ICD-10-CM | POA: Diagnosis present

## 2023-08-30 DIAGNOSIS — K5730 Diverticulosis of large intestine without perforation or abscess without bleeding: Secondary | ICD-10-CM

## 2023-08-30 MED ORDER — SODIUM CHLORIDE 0.9 % IV SOLN: 500.0000 mL | Freq: Once | INTRAVENOUS | Status: DC

## 2023-08-30 NOTE — Progress Notes (Signed)
To pacu, Vss.Report to Rn.tb

## 2023-08-30 NOTE — Progress Notes (Signed)
 Pt's states no medical or surgical changes since previsit or office visit.

## 2023-08-30 NOTE — Op Note (Signed)
 [facility] Patient Name: Penny Kerr Procedure Date: 08/30/2023 8:51 AM MRN: [id number] Endoscopist: Lorin Picket E. Tomasa Rand , MD, [id number] Age: 53 Referring MD:  Date of Birth: Feb 02, 1971 Gender: Female Account #: [id number] Procedure:                Colonoscopy Indications:              Screening for colorectal malignant neoplasm, This                            is the patient's first colonoscopy Medicines:                Monitored Anesthesia Care Procedure:                Pre-Anesthesia Assessment:                           - Prior to the procedure, a History and Physical                            was performed, and patient medications and                            allergies were reviewed. The patient's tolerance of                            previous anesthesia was also reviewed. The risks                            and benefits of the procedure and the sedation                            options and risks were discussed with the patient.                            All questions were answered, and informed consent                            was obtained. Prior Anticoagulants: The patient has                            taken no anticoagulant or antiplatelet agents. ASA                            Grade Assessment: II - A patient with mild systemic                            disease. After reviewing the risks and benefits,                            the patient was deemed in satisfactory condition to                            undergo the procedure.  After obtaining informed consent, the colonoscope                            was passed under direct vision. Throughout the                            procedure, the patient's blood pressure, pulse, and                            oxygen saturations were monitored continuously. The                            CF [id number] #[id number] was introduced through the anus                            and advanced to  the the terminal ileum, with                            identification of the appendiceal orifice and IC                            valve. The colonoscopy was performed without                            difficulty. The patient tolerated the procedure                            well. The quality of the bowel preparation was                            good. The terminal ileum, ileocecal valve,                            appendiceal orifice, and rectum were photographed.                            The bowel preparation used was SUFLAVE via split                            dose instruction. Scope In: 8:57:34 AM Scope Out: 9:08:45 AM Scope Withdrawal Time: 0 hours 8 minutes 34 seconds  Total Procedure Duration: 0 hours 11 minutes 11 seconds  Findings:                 The perianal and digital rectal examinations were                            normal. Pertinent negatives include normal                            sphincter tone and no palpable rectal lesions.                           A few small-mouthed diverticula were found in the  sigmoid colon.                           The exam was otherwise normal throughout the                            examined colon.                           The terminal ileum appeared normal.                           The retroflexed view of the distal rectum and anal                            verge was normal and showed no anal or rectal                            abnormalities. Complications:            No immediate complications. Estimated Blood Loss:     Estimated blood loss: none. Impression:               - Mild diverticulosis in the sigmoid colon.                           - The examined portion of the ileum was normal.                           - The distal rectum and anal verge are normal on                            retroflexion view.                           - No specimens collected. Recommendation:           - Patient has  a contact number available for                            emergencies. The signs and symptoms of potential                            delayed complications were discussed with the                            patient. Return to normal activities tomorrow.                            Written discharge instructions were provided to the                            patient.                           - Resume previous diet.                           -  Continue present medications.                           - Repeat colonoscopy in 10 years for screening                            purposes. Braylinn Gulden E. Tomasa Rand, MD 08/30/2023 9:12:45 AM This report has been signed electronically.

## 2023-08-30 NOTE — Patient Instructions (Addendum)
   Handout on diverticulosis given to you today    YOU HAD AN ENDOSCOPIC PROCEDURE TODAY AT THE [facility]:   Refer to the procedure report that was given to you for any specific questions about what was found during the examination.  If the procedure report does not answer your questions, please call your gastroenterologist to clarify.  If you requested that your care partner not be given the details of your procedure findings, then the procedure report has been included in a sealed envelope for you to review at your convenience later.  YOU SHOULD EXPECT: Some feelings of bloating in the abdomen. Passage of more gas than usual.  Walking can help get rid of the air that was put into your GI tract during the procedure and reduce the bloating. If you had a lower endoscopy (such as a colonoscopy or flexible sigmoidoscopy) you may notice spotting of blood in your stool or on the toilet paper. If you underwent a bowel prep for your procedure, you may not have a normal bowel movement for a few days.  Please Note:  You might notice some irritation and congestion in your nose or some drainage.  This is from the oxygen used during your procedure.  There is no need for concern and it should clear up in a day or so.  SYMPTOMS TO REPORT IMMEDIATELY:  Following lower endoscopy (colonoscopy or flexible sigmoidoscopy):  Excessive amounts of blood in the stool  Significant tenderness or worsening of abdominal pains  Swelling of the abdomen that is new, acute  Fever of 100F or higher   For urgent or emergent issues, a gastroenterologist can be reached at any hour by calling [phone]. Do not use MyChart messaging for urgent concerns.    DIET:  We do recommend a small meal at first, but then you may proceed to your regular diet.  Drink plenty of fluids but you should avoid alcoholic beverages for 24 hours.  ACTIVITY:  You should plan to take it easy for the rest of today and you should  NOT DRIVE or use heavy machinery until tomorrow (because of the sedation medicines used during the test).    FOLLOW UP: Our staff will call the number listed on your records the next business day following your procedure.  We will call around 7:15- 8:00 am to check on you and address any questions or concerns that you may have regarding the information given to you following your procedure. If we do not reach you, we will leave a message.     If any biopsies were taken you will be contacted by phone or by letter within the next 1-3 weeks.  Please call us at [phone] if you have not heard about the biopsies in 3 weeks.    SIGNATURES/CONFIDENTIALITY: You and/or your care partner have signed paperwork which will be entered into your electronic medical record.  These signatures attest to the fact that that the information above on your After Visit Summary has been reviewed and is understood.  Full responsibility of the confidentiality of this discharge information lies with you and/or your care-partner. 

## 2023-08-30 NOTE — Progress Notes (Signed)
 [facility] History and Physical   Primary Care Physician:  Cleatis Polka., MD   Reason for Procedure:   Colon cancer screening  Plan:    Screening colonoscopy     HPI: Penny Kerr is a 53 y.o. female undergoing initial average risk screening colonoscopy.  She has no family history of colon cancer and no chronic GI symptoms.    Past Medical History:  Diagnosis Date   Low vitamin B12 level    Migraines    TIA (transient ischemic attack)     Past Surgical History:  Procedure Laterality Date   CESAREAN SECTION      Prior to Admission medications   Medication Sig Start Date End Date Taking? Authorizing Provider  aspirin EC 81 MG tablet Take 81 mg by mouth at bedtime.   Yes [provider]  topiramate (TOPAMAX) 25 MG tablet Take 2 tablets (50 mg total) by mouth 2 (two) times daily. 04/06/14  Yes Anson Fret, MD  topiramate (TOPAMAX) 50 MG tablet Take 50 mg by mouth daily. 08/16/23  Yes [provider]  ascorbic acid (VITAMIN C) 100 MG tablet 1 tablet Orally Once a day    [provider]  baclofen (LIORESAL) 10 MG tablet Take 0.5-1 tablets (5-10 mg total) by mouth 3 (three) times daily as needed for muscle spasms. Patient not taking: Reported on 08/16/2023 07/04/20   Hilts, Casimiro Needle, MD  Biotin 10 MG TABS 1 tablet Orally Once a day    [provider]  cetirizine (ZYRTEC) 10 MG tablet Take 10 mg by mouth at bedtime.     [provider]  cholecalciferol (VITAMIN D3) 25 MCG (1000 UNIT) tablet 1 tablet Orally Once a day    [provider]  Cyanocobalamin (B-12) 2500 MCG TABS Take by mouth.    [provider]  fluticasone (FLONASE) 50 MCG/ACT nasal spray Place 1 spray into both nostrils daily with breakfast.    [provider]  Rimegepant Sulfate (NURTEC) 75 MG TBDP 1 tablet on the tongue and allow to dissolve Orally as needed for 90 days 07/11/21   [provider]  WEGOVY 2.4  MG/0.75ML SOAJ inject 2.4mg  Subcutaneous weekly for 30 days 07/31/22   [provider]    Current Outpatient Medications  Medication Sig Dispense Refill   aspirin EC 81 MG tablet Take 81 mg by mouth at bedtime.     topiramate (TOPAMAX) 25 MG tablet Take 2 tablets (50 mg total) by mouth 2 (two) times daily. 120 tablet 6   topiramate (TOPAMAX) 50 MG tablet Take 50 mg by mouth daily.     ascorbic acid (VITAMIN C) 100 MG tablet 1 tablet Orally Once a day     baclofen (LIORESAL) 10 MG tablet Take 0.5-1 tablets (5-10 mg total) by mouth 3 (three) times daily as needed for muscle spasms. (Patient not taking: Reported on 08/16/2023) 30 each 3   Biotin 10 MG TABS 1 tablet Orally Once a day     cetirizine (ZYRTEC) 10 MG tablet Take 10 mg by mouth at bedtime.      cholecalciferol (VITAMIN D3) 25 MCG (1000 UNIT) tablet 1 tablet Orally Once a day     Cyanocobalamin (B-12) 2500 MCG TABS Take by mouth.     fluticasone (FLONASE) 50 MCG/ACT nasal spray Place 1 spray into both nostrils daily with breakfast.     Rimegepant Sulfate (NURTEC) 75 MG TBDP 1 tablet on the tongue and allow to dissolve Orally as needed  for 90 days     WEGOVY 2.4 MG/0.75ML SOAJ inject 2.4mg  Subcutaneous weekly for 30 days     Current Facility-Administered Medications  Medication Dose Route Frequency Provider Last Rate Last Admin   0.9 %  sodium chloride infusion  500 mL Intravenous Once Jenel Lucks, MD        Allergies as of 08/30/2023   (No Known Allergies)    Family History  Problem Relation Age of Onset   Diabetes Mother    CAD Father    Deep vein thrombosis Sister    Migraines Neg Hx    Seizures Neg Hx    Colon cancer Neg Hx    Colon polyps Neg Hx    Esophageal cancer Neg Hx    Stomach cancer Neg Hx    Rectal cancer Neg Hx     Social History   Socioeconomic History   Marital status: Single    Spouse name: Not on file   Number of children: Not on file   Years of education: Not on file   Highest  education level: Not on file  Occupational History   Not on file  Tobacco Use   Smoking status: Never   Smokeless tobacco: Never  Substance and Sexual Activity   Alcohol use: No    Alcohol/week: 0.0 standard drinks of alcohol   Drug use: No   Sexual activity: Yes    Birth control/protection: None, Post-menopausal  Other Topics Concern   Not on file  Social History Narrative   Not on file   Social Drivers of Health   Financial Resource Strain: Not on file  Food Insecurity: Not on file  Transportation Needs: Not on file  Physical Activity: Not on file  Stress: Not on file  Social Connections: Not on file  Intimate Partner Violence: Not on file    Review of Systems:  All other review of systems negative except as mentioned in the HPI.  Physical Exam: Vital signs BP 108/64   Pulse 72   Temp 97.8 F (36.6 C) (Temporal)   Ht 5\' 1"  (1.549 m)   Wt 167 lb (75.8 kg)   LMP 03/10/2014   SpO2 99%   BMI 31.55 kg/m   General:   Alert,  Well-developed, well-nourished, pleasant and cooperative in NAD Airway:  Mallampati 2 Lungs:  Clear throughout to auscultation.   Heart:  Regular rate and rhythm; no murmurs, clicks, rubs,  or gallops. Abdomen:  Soft, nontender and nondistended. Normal bowel sounds.   Neuro/Psych:  Normal mood and affect. A and O x 3   Mykel Sponaugle E. Tomasa Rand, MD [facility]

## 2023-09-02 ENCOUNTER — Telehealth: Payer: Self-pay

## 2023-09-02 NOTE — Telephone Encounter (Signed)
Attempted to reach patient for post-procedure f/u call. No answer. Left message for her to please not hesitate to call if she has any questions/concerns regarding her care. 

## 2024-05-14 ENCOUNTER — Encounter: Payer: Self-pay | Admitting: Gastroenterology
